# Patient Record
Sex: Male | Born: 1935 | Race: White | Hispanic: No | Marital: Married | Smoking: Never smoker
Health system: Southern US, Community
[De-identification: ages and names within clinical notes are randomized; demographics above are authoritative.]

## PROBLEM LIST (undated history)

## (undated) DIAGNOSIS — T4145XA Adverse effect of unspecified anesthetic, initial encounter: Secondary | ICD-10-CM

## (undated) DIAGNOSIS — E785 Hyperlipidemia, unspecified: Secondary | ICD-10-CM

## (undated) DIAGNOSIS — I10 Essential (primary) hypertension: Secondary | ICD-10-CM

## (undated) DIAGNOSIS — T8859XA Other complications of anesthesia, initial encounter: Secondary | ICD-10-CM

## (undated) DIAGNOSIS — Z8679 Personal history of other diseases of the circulatory system: Secondary | ICD-10-CM

## (undated) DIAGNOSIS — N200 Calculus of kidney: Secondary | ICD-10-CM

## (undated) DIAGNOSIS — F0390 Unspecified dementia without behavioral disturbance: Secondary | ICD-10-CM

## (undated) DIAGNOSIS — M21372 Foot drop, left foot: Secondary | ICD-10-CM

## (undated) DIAGNOSIS — I495 Sick sinus syndrome: Secondary | ICD-10-CM

## (undated) DIAGNOSIS — I639 Cerebral infarction, unspecified: Secondary | ICD-10-CM

## (undated) DIAGNOSIS — I2510 Atherosclerotic heart disease of native coronary artery without angina pectoris: Secondary | ICD-10-CM

## (undated) HISTORY — DX: Hyperlipidemia, unspecified: E78.5

## (undated) HISTORY — PX: BACK SURGERY: SHX140

## (undated) HISTORY — DX: Unspecified dementia, unspecified severity, without behavioral disturbance, psychotic disturbance, mood disturbance, and anxiety: F03.90

## (undated) HISTORY — DX: Sick sinus syndrome: I49.5

## (undated) HISTORY — PX: APPENDECTOMY: SHX54

## (undated) HISTORY — DX: Calculus of kidney: N20.0

## (undated) HISTORY — PX: HAND SURGERY: SHX662

## (undated) HISTORY — DX: Essential (primary) hypertension: I10

## (undated) HISTORY — DX: Cerebral infarction, unspecified: I63.9

## (undated) HISTORY — DX: Atherosclerotic heart disease of native coronary artery without angina pectoris: I25.10

## (undated) HISTORY — DX: Personal history of other diseases of the circulatory system: Z86.79

## (undated) HISTORY — DX: Foot drop, left foot: M21.372

---

## 1958-10-31 HISTORY — PX: TONSILLECTOMY AND ADENOIDECTOMY: SUR1326

## 1997-10-03 ENCOUNTER — Ambulatory Visit (HOSPITAL_COMMUNITY): Admission: RE | Admit: 1997-10-03 | Discharge: 1997-10-03 | Payer: Self-pay | Admitting: Internal Medicine

## 1998-03-01 HISTORY — PX: INSERT / REPLACE / REMOVE PACEMAKER: SUR710

## 1998-11-25 ENCOUNTER — Encounter: Payer: Self-pay | Admitting: Orthopedic Surgery

## 1998-11-25 ENCOUNTER — Ambulatory Visit (HOSPITAL_COMMUNITY): Admission: RE | Admit: 1998-11-25 | Discharge: 1998-11-25 | Payer: Self-pay | Admitting: Orthopedic Surgery

## 1998-12-02 ENCOUNTER — Ambulatory Visit (HOSPITAL_BASED_OUTPATIENT_CLINIC_OR_DEPARTMENT_OTHER): Admission: RE | Admit: 1998-12-02 | Discharge: 1998-12-02 | Payer: Self-pay | Admitting: Orthopedic Surgery

## 1999-07-14 ENCOUNTER — Ambulatory Visit (HOSPITAL_COMMUNITY): Admission: RE | Admit: 1999-07-14 | Discharge: 1999-07-14 | Payer: Self-pay | Admitting: Gastroenterology

## 1999-07-14 ENCOUNTER — Encounter (INDEPENDENT_AMBULATORY_CARE_PROVIDER_SITE_OTHER): Payer: Self-pay

## 2000-04-23 ENCOUNTER — Encounter: Payer: Self-pay | Admitting: Emergency Medicine

## 2000-04-23 ENCOUNTER — Inpatient Hospital Stay (HOSPITAL_COMMUNITY): Admission: EM | Admit: 2000-04-23 | Discharge: 2000-04-26 | Payer: Self-pay | Admitting: Emergency Medicine

## 2000-05-03 ENCOUNTER — Encounter: Admission: RE | Admit: 2000-05-03 | Discharge: 2000-05-03 | Payer: Self-pay | Admitting: Family Medicine

## 2000-08-25 ENCOUNTER — Ambulatory Visit (HOSPITAL_COMMUNITY): Admission: RE | Admit: 2000-08-25 | Discharge: 2000-08-25 | Payer: Self-pay | Admitting: *Deleted

## 2000-09-06 ENCOUNTER — Ambulatory Visit (HOSPITAL_COMMUNITY): Admission: AD | Admit: 2000-09-06 | Discharge: 2000-09-06 | Payer: Self-pay | Admitting: *Deleted

## 2000-09-09 ENCOUNTER — Ambulatory Visit (HOSPITAL_COMMUNITY): Admission: RE | Admit: 2000-09-09 | Discharge: 2000-09-09 | Payer: Self-pay | Admitting: *Deleted

## 2000-12-02 ENCOUNTER — Encounter: Payer: Self-pay | Admitting: *Deleted

## 2000-12-02 ENCOUNTER — Ambulatory Visit (HOSPITAL_COMMUNITY): Admission: RE | Admit: 2000-12-02 | Discharge: 2000-12-02 | Payer: Self-pay | Admitting: *Deleted

## 2001-01-23 ENCOUNTER — Ambulatory Visit (HOSPITAL_COMMUNITY): Admission: RE | Admit: 2001-01-23 | Discharge: 2001-01-24 | Payer: Self-pay | Admitting: *Deleted

## 2001-01-24 ENCOUNTER — Encounter: Payer: Self-pay | Admitting: *Deleted

## 2002-01-01 ENCOUNTER — Encounter: Payer: Self-pay | Admitting: Family Medicine

## 2002-01-01 ENCOUNTER — Encounter: Admission: RE | Admit: 2002-01-01 | Discharge: 2002-01-01 | Payer: Self-pay | Admitting: Family Medicine

## 2002-03-01 DIAGNOSIS — I495 Sick sinus syndrome: Secondary | ICD-10-CM

## 2002-03-01 HISTORY — DX: Sick sinus syndrome: I49.5

## 2002-03-09 ENCOUNTER — Inpatient Hospital Stay (HOSPITAL_COMMUNITY): Admission: EM | Admit: 2002-03-09 | Discharge: 2002-03-13 | Payer: Self-pay | Admitting: Emergency Medicine

## 2002-03-09 ENCOUNTER — Encounter: Payer: Self-pay | Admitting: Emergency Medicine

## 2002-03-12 ENCOUNTER — Encounter: Payer: Self-pay | Admitting: *Deleted

## 2002-03-13 ENCOUNTER — Encounter: Payer: Self-pay | Admitting: *Deleted

## 2004-12-15 ENCOUNTER — Ambulatory Visit (HOSPITAL_COMMUNITY): Admission: RE | Admit: 2004-12-15 | Discharge: 2004-12-15 | Payer: Self-pay | Admitting: Gastroenterology

## 2004-12-15 ENCOUNTER — Encounter (INDEPENDENT_AMBULATORY_CARE_PROVIDER_SITE_OTHER): Payer: Self-pay | Admitting: *Deleted

## 2005-04-15 ENCOUNTER — Ambulatory Visit: Admission: RE | Admit: 2005-04-15 | Discharge: 2005-04-15 | Payer: Self-pay | Admitting: Orthopedic Surgery

## 2005-04-16 ENCOUNTER — Ambulatory Visit (HOSPITAL_COMMUNITY): Admission: RE | Admit: 2005-04-16 | Discharge: 2005-04-16 | Payer: Self-pay | Admitting: *Deleted

## 2005-04-29 ENCOUNTER — Ambulatory Visit: Payer: Self-pay | Admitting: Physical Medicine & Rehabilitation

## 2005-04-29 ENCOUNTER — Inpatient Hospital Stay (HOSPITAL_COMMUNITY): Admission: RE | Admit: 2005-04-29 | Discharge: 2005-05-07 | Payer: Self-pay | Admitting: Surgery

## 2005-04-29 DIAGNOSIS — I6789 Other cerebrovascular disease: Secondary | ICD-10-CM

## 2005-04-29 HISTORY — PX: CORONARY ARTERY BYPASS GRAFT: SHX141

## 2005-05-17 ENCOUNTER — Ambulatory Visit: Payer: Self-pay | Admitting: Family Medicine

## 2005-05-17 ENCOUNTER — Inpatient Hospital Stay (HOSPITAL_COMMUNITY): Admission: EM | Admit: 2005-05-17 | Discharge: 2005-05-21 | Payer: Self-pay | Admitting: Emergency Medicine

## 2005-05-18 ENCOUNTER — Encounter: Payer: Self-pay | Admitting: Vascular Surgery

## 2005-05-21 ENCOUNTER — Inpatient Hospital Stay (HOSPITAL_COMMUNITY)
Admission: RE | Admit: 2005-05-21 | Discharge: 2005-06-07 | Payer: Self-pay | Admitting: Physical Medicine & Rehabilitation

## 2005-06-08 ENCOUNTER — Encounter
Admission: RE | Admit: 2005-06-08 | Discharge: 2005-09-06 | Payer: Self-pay | Admitting: Physical Medicine & Rehabilitation

## 2005-06-29 ENCOUNTER — Encounter: Admission: RE | Admit: 2005-06-29 | Discharge: 2005-06-29 | Payer: Self-pay | Admitting: Surgery

## 2005-07-05 ENCOUNTER — Encounter
Admission: RE | Admit: 2005-07-05 | Discharge: 2005-10-03 | Payer: Self-pay | Admitting: Physical Medicine & Rehabilitation

## 2005-08-02 ENCOUNTER — Ambulatory Visit: Payer: Self-pay | Admitting: Physical Medicine & Rehabilitation

## 2005-09-24 ENCOUNTER — Ambulatory Visit: Payer: Self-pay | Admitting: Physical Medicine & Rehabilitation

## 2005-11-08 ENCOUNTER — Ambulatory Visit: Payer: Self-pay | Admitting: Family Medicine

## 2005-11-11 ENCOUNTER — Ambulatory Visit: Payer: Self-pay | Admitting: Family Medicine

## 2005-11-29 HISTORY — PX: TOTAL KNEE ARTHROPLASTY: SHX125

## 2005-12-29 ENCOUNTER — Inpatient Hospital Stay (HOSPITAL_COMMUNITY): Admission: RE | Admit: 2005-12-29 | Discharge: 2006-01-02 | Payer: Self-pay | Admitting: Orthopedic Surgery

## 2006-01-11 ENCOUNTER — Ambulatory Visit: Payer: Self-pay

## 2006-09-29 IMAGING — CR DG CHEST 2V
2 series · 2 of 2 positions shown · non-contrast
Comparison: none

CLINICAL DATA: Weakness, dizziness. Tingling left arm and leg.  Open heart surgery two weeks ago.  
 CHEST- 2 VIEW:

[view not recorded (1 of 2)]
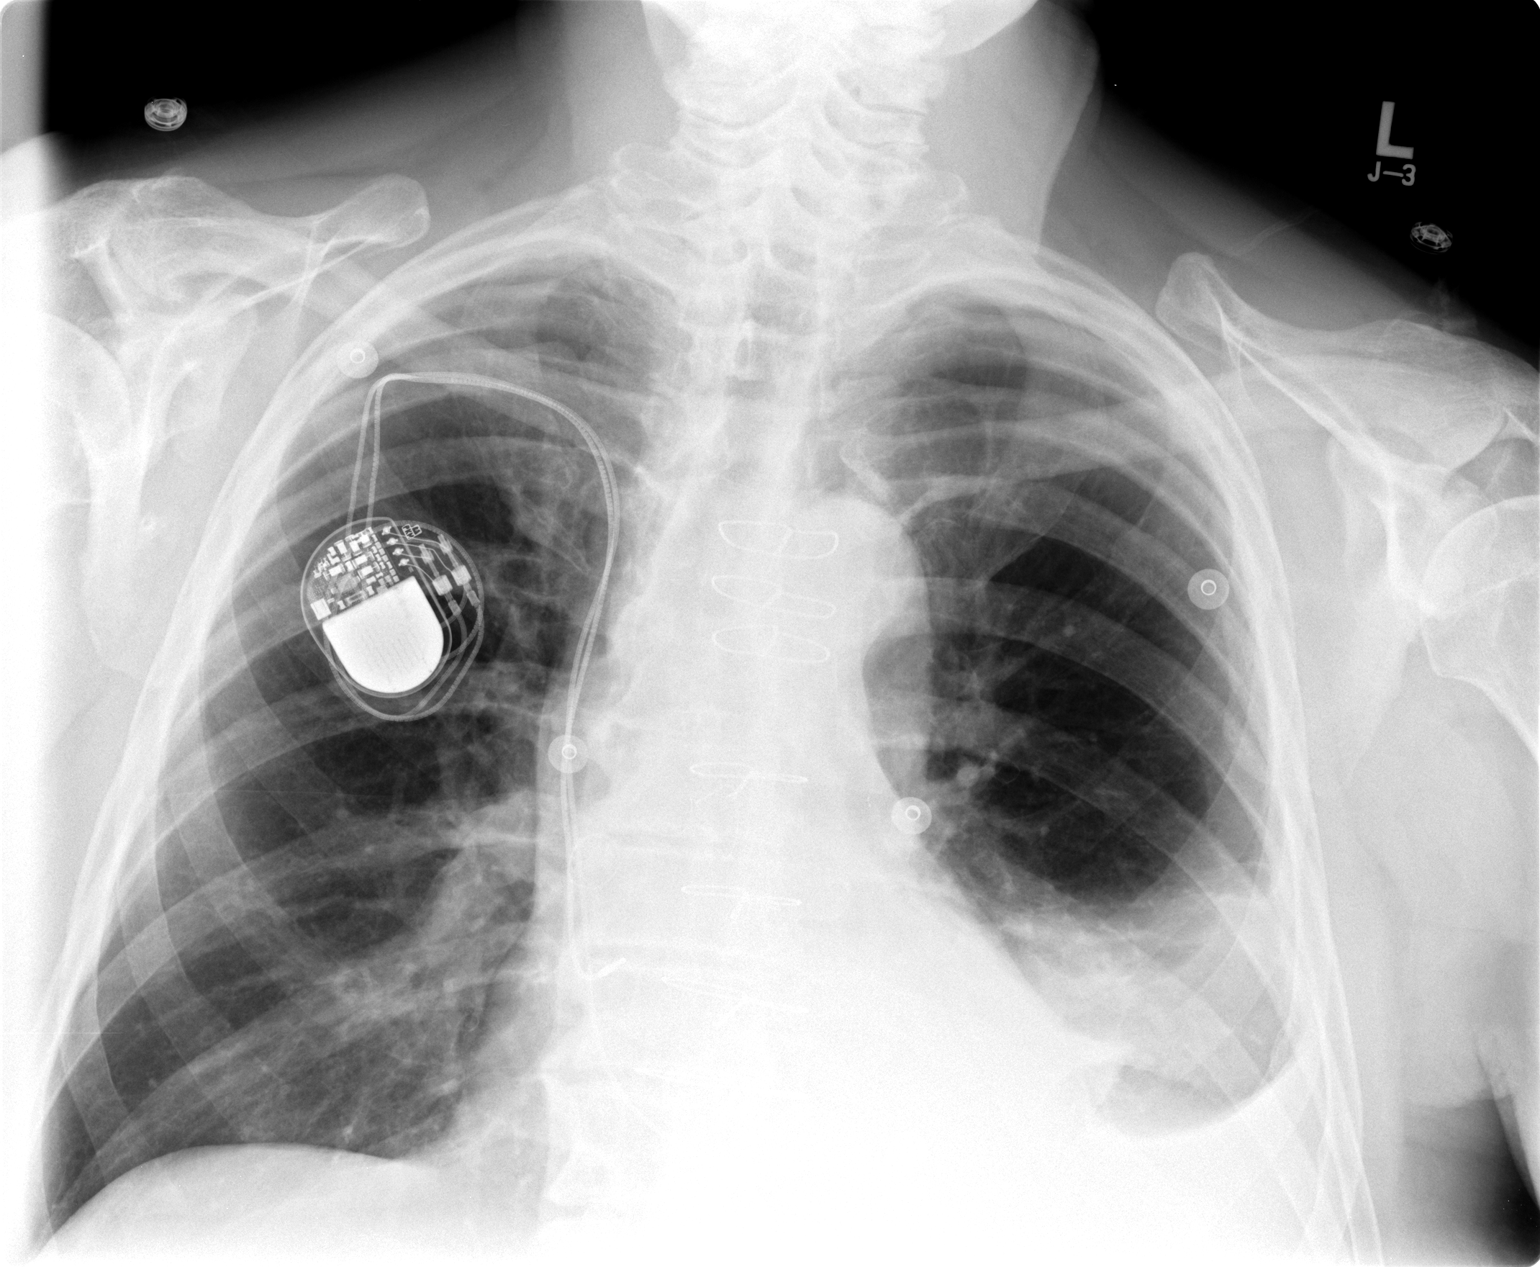

[view not recorded (2 of 2)]
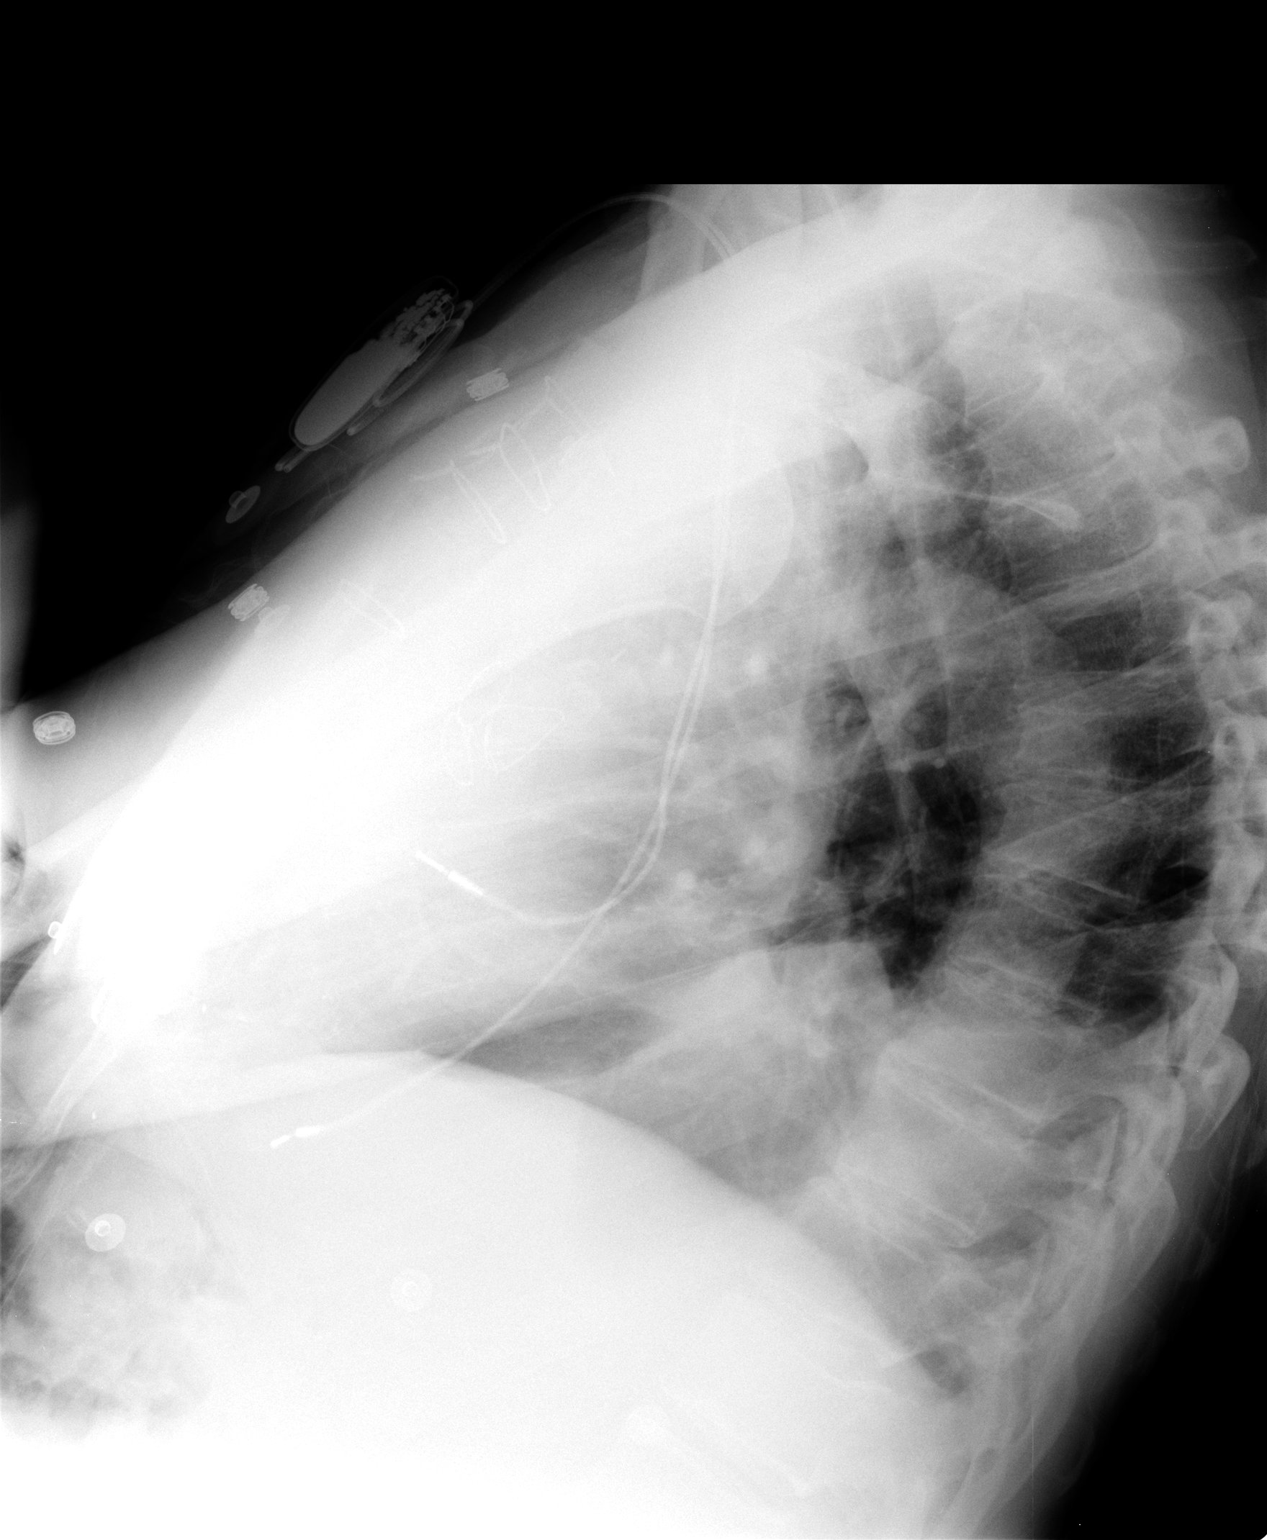

[2 of 2 positions shown; findings below may reference images not displayed]

FINDINGS: AP and lateral semierect films of the chest are made and are compared to previous studies of 05/01/05 and show again hyperaeration of the left base with left pleural effusion and/or scarring. The dual lead pacer system appears to be intact.  There has been previous coronary artery bypass grafts. No definite edema or pneumothorax is seen.
IMPRESSION: Postop changes of coronary artery bypass grafts with some atelectasis of the left base.  No pneumothorax or edema is present.  Stable dual lead pacer system.

## 2007-03-23 ENCOUNTER — Encounter (INDEPENDENT_AMBULATORY_CARE_PROVIDER_SITE_OTHER): Payer: Self-pay | Admitting: *Deleted

## 2007-03-23 ENCOUNTER — Ambulatory Visit: Payer: Self-pay | Admitting: Family Medicine

## 2007-03-23 DIAGNOSIS — I2510 Atherosclerotic heart disease of native coronary artery without angina pectoris: Secondary | ICD-10-CM

## 2007-03-23 DIAGNOSIS — K209 Esophagitis, unspecified: Secondary | ICD-10-CM

## 2007-03-23 DIAGNOSIS — I10 Essential (primary) hypertension: Secondary | ICD-10-CM | POA: Insufficient documentation

## 2007-03-23 DIAGNOSIS — M1710 Unilateral primary osteoarthritis, unspecified knee: Secondary | ICD-10-CM

## 2007-03-23 DIAGNOSIS — F329 Major depressive disorder, single episode, unspecified: Secondary | ICD-10-CM

## 2007-03-23 DIAGNOSIS — N209 Urinary calculus, unspecified: Secondary | ICD-10-CM

## 2007-03-23 DIAGNOSIS — E782 Mixed hyperlipidemia: Secondary | ICD-10-CM

## 2007-03-23 DIAGNOSIS — F3289 Other specified depressive episodes: Secondary | ICD-10-CM | POA: Insufficient documentation

## 2007-03-23 LAB — CONVERTED CEMR LAB
ALT: 16 units/L (ref 0–53)
AST: 21 units/L (ref 0–37)
Albumin: 4.5 g/dL (ref 3.5–5.2)
Alkaline Phosphatase: 39 units/L (ref 39–117)
BUN: 15 mg/dL (ref 6–23)
CO2: 26 meq/L (ref 19–32)
Calcium: 9.1 mg/dL (ref 8.4–10.5)
Chloride: 102 meq/L (ref 96–112)
Creatinine, Ser: 1.08 mg/dL (ref 0.40–1.50)
Direct LDL: 74 mg/dL
Glucose, Bld: 76 mg/dL (ref 70–99)
Potassium: 4.8 meq/L (ref 3.5–5.3)
Sodium: 139 meq/L (ref 135–145)
Total Bilirubin: 1.4 mg/dL — ABNORMAL HIGH (ref 0.3–1.2)
Total Protein: 6.9 g/dL (ref 6.0–8.3)

## 2007-03-27 LAB — CONVERTED CEMR LAB: LDL Cholesterol: 74 mg/dL

## 2007-04-02 HISTORY — PX: REVISION TOTAL KNEE ARTHROPLASTY: SHX767

## 2007-04-10 ENCOUNTER — Inpatient Hospital Stay (HOSPITAL_COMMUNITY): Admission: RE | Admit: 2007-04-10 | Discharge: 2007-04-13 | Payer: Self-pay | Admitting: Orthopedic Surgery

## 2007-04-11 ENCOUNTER — Ambulatory Visit: Payer: Self-pay | Admitting: Physical Medicine & Rehabilitation

## 2007-04-25 ENCOUNTER — Ambulatory Visit: Payer: Self-pay | Admitting: Family Medicine

## 2007-04-25 ENCOUNTER — Ambulatory Visit (HOSPITAL_COMMUNITY): Admission: RE | Admit: 2007-04-25 | Discharge: 2007-04-25 | Payer: Self-pay | Admitting: Family Medicine

## 2007-04-25 ENCOUNTER — Telehealth: Payer: Self-pay | Admitting: *Deleted

## 2007-04-25 DIAGNOSIS — R42 Dizziness and giddiness: Secondary | ICD-10-CM

## 2007-04-26 ENCOUNTER — Telehealth: Payer: Self-pay | Admitting: *Deleted

## 2007-04-28 ENCOUNTER — Encounter (INDEPENDENT_AMBULATORY_CARE_PROVIDER_SITE_OTHER): Payer: Self-pay | Admitting: *Deleted

## 2007-05-05 ENCOUNTER — Encounter (INDEPENDENT_AMBULATORY_CARE_PROVIDER_SITE_OTHER): Payer: Self-pay | Admitting: *Deleted

## 2007-05-05 ENCOUNTER — Ambulatory Visit: Payer: Self-pay | Admitting: Family Medicine

## 2007-05-05 LAB — CONVERTED CEMR LAB
HCT: 39.5 %
Hemoglobin: 13.1 g/dL
MCHC: 33.2 g/dL
MCV: 97.5 fL
Platelets: 340 K/uL
RBC: 4.05 M/uL — ABNORMAL LOW
RDW: 13.9 %
WBC: 6.3 10*3/microliter

## 2007-05-25 ENCOUNTER — Ambulatory Visit: Payer: Self-pay | Admitting: Family Medicine

## 2007-05-25 ENCOUNTER — Encounter (INDEPENDENT_AMBULATORY_CARE_PROVIDER_SITE_OTHER): Payer: Self-pay | Admitting: *Deleted

## 2007-05-30 ENCOUNTER — Encounter (INDEPENDENT_AMBULATORY_CARE_PROVIDER_SITE_OTHER): Payer: Self-pay | Admitting: *Deleted

## 2007-06-06 ENCOUNTER — Encounter (INDEPENDENT_AMBULATORY_CARE_PROVIDER_SITE_OTHER): Payer: Self-pay | Admitting: *Deleted

## 2007-07-27 ENCOUNTER — Encounter: Payer: Self-pay | Admitting: *Deleted

## 2007-11-15 ENCOUNTER — Ambulatory Visit: Payer: Self-pay | Admitting: Family Medicine

## 2007-11-15 ENCOUNTER — Encounter: Payer: Self-pay | Admitting: Family Medicine

## 2007-11-23 ENCOUNTER — Encounter: Payer: Self-pay | Admitting: Family Medicine

## 2007-12-13 ENCOUNTER — Encounter: Payer: Self-pay | Admitting: Family Medicine

## 2007-12-13 ENCOUNTER — Ambulatory Visit: Payer: Self-pay | Admitting: Family Medicine

## 2007-12-13 DIAGNOSIS — G4730 Sleep apnea, unspecified: Secondary | ICD-10-CM | POA: Insufficient documentation

## 2007-12-13 DIAGNOSIS — M25539 Pain in unspecified wrist: Secondary | ICD-10-CM

## 2007-12-13 LAB — CONVERTED CEMR LAB: PSA: 0.92 ng/mL (ref 0.10–4.00)

## 2007-12-19 ENCOUNTER — Telehealth (INDEPENDENT_AMBULATORY_CARE_PROVIDER_SITE_OTHER): Payer: Self-pay | Admitting: *Deleted

## 2007-12-21 ENCOUNTER — Encounter: Payer: Self-pay | Admitting: Family Medicine

## 2007-12-22 ENCOUNTER — Encounter: Admission: RE | Admit: 2007-12-22 | Discharge: 2007-12-22 | Payer: Self-pay | Admitting: Family Medicine

## 2007-12-29 ENCOUNTER — Telehealth: Payer: Self-pay | Admitting: Family Medicine

## 2008-06-30 ENCOUNTER — Encounter: Payer: Self-pay | Admitting: Family Medicine

## 2008-11-12 ENCOUNTER — Encounter: Admission: RE | Admit: 2008-11-12 | Discharge: 2008-11-12 | Payer: Self-pay | Admitting: Internal Medicine

## 2008-11-19 ENCOUNTER — Ambulatory Visit: Payer: Self-pay | Admitting: Internal Medicine

## 2008-11-19 DIAGNOSIS — J310 Chronic rhinitis: Secondary | ICD-10-CM

## 2008-11-19 DIAGNOSIS — R042 Hemoptysis: Secondary | ICD-10-CM | POA: Insufficient documentation

## 2009-04-09 ENCOUNTER — Emergency Department (HOSPITAL_COMMUNITY): Admission: EM | Admit: 2009-04-09 | Discharge: 2009-04-09 | Payer: Self-pay | Admitting: Emergency Medicine

## 2010-03-22 ENCOUNTER — Encounter: Payer: Self-pay | Admitting: Surgery

## 2010-04-30 LAB — HM COLONOSCOPY

## 2010-05-20 LAB — COMPREHENSIVE METABOLIC PANEL
Alkaline Phosphatase: 45 U/L (ref 39–117)
BUN: 14 mg/dL (ref 6–23)
CO2: 25 mEq/L (ref 19–32)
Chloride: 101 mEq/L (ref 96–112)
Creatinine, Ser: 1.1 mg/dL (ref 0.4–1.5)
GFR calc non Af Amer: >60 mL/min (ref >60)
Glucose, Bld: 121 mg/dL — ABNORMAL HIGH (ref 70–99)
Potassium: 4 mEq/L (ref 3.5–5.1)
Total Bilirubin: 1.8 mg/dL — ABNORMAL HIGH (ref 0.3–1.2)

## 2010-05-20 LAB — CBC
HCT: 44 % (ref 39.0–52.0)
Hemoglobin: 15.6 g/dL (ref 13.0–17.0)
MCV: 99.9 fL (ref 78.0–100.0)
WBC: 10.1 10*3/uL (ref 4.0–10.5)

## 2010-05-20 LAB — URINALYSIS, ROUTINE W REFLEX MICROSCOPIC
Bilirubin Urine: NEGATIVE
Ketones, ur: NEGATIVE mg/dL
Leukocytes, UA: NEGATIVE
Nitrite: NEGATIVE
Urobilinogen, UA: 1 mg/dL (ref 0.0–1.0)

## 2010-05-20 LAB — DIFFERENTIAL
Basophils Absolute: 0 10*3/uL (ref 0.0–0.1)
Basophils Relative: 0 % (ref 0–1)
Lymphocytes Relative: 8 % — ABNORMAL LOW (ref 12–46)
Monocytes Absolute: 0.5 10*3/uL (ref 0.1–1.0)
Neutro Abs: 8.8 10*3/uL — ABNORMAL HIGH (ref 1.7–7.7)
Neutrophils Relative %: 87 % — ABNORMAL HIGH (ref 43–77)

## 2010-05-20 LAB — LIPASE, BLOOD: Lipase: 33 U/L (ref 11–59)

## 2010-05-20 LAB — URINE MICROSCOPIC-ADD ON

## 2010-07-14 NOTE — Op Note (Signed)
Kurt [name], Kurt [name]             ACCOUNT NO.:  [id number]   MEDICAL RECORD NO.:  [id number]          PATIENT TYPE:  INP   LOCATION:  2550                         FACILITY:  [facility]   PHYSICIAN:  Robert A. Thurston Hole, M.D. DATE OF BIRTH:  1932-02-09   DATE OF PROCEDURE:  04/10/2007  DATE OF DISCHARGE:                               OPERATIVE REPORT   PREOPERATIVE DIAGNOSIS:  Right knee degenerative joint disease.   POSTOPERATIVE DIAGNOSIS:  Right knee degenerative joint disease.   PROCEDURE:  Right total knee replacement using Dupuy cemented total knee  system with #4 cemented femur, #5 cemented tibia, with 12.5 mm  polyethylene RP tibial spacer and 38 mm polyethylene cemented patella.   SURGEON:  Elana Alm. Thurston Hole, M.D.   ASSISTANT:  Julien Girt, P.A.   ANESTHESIA:  General.   OPERATIVE TIME:  1 hour 30 minutes.   COMPLICATIONS:  None.   DESCRIPTION OF PROCEDURE:  The patient is brought to the operating room  on April 10, 2007, after femoral nerve block was placed in the  holding room by anesthesia.  He was placed on the operating table in the  supine position.  He received Ancef 2 grams IV preoperatively for  prophylaxis.  After being placed under general anesthesia, he had a  Foley catheter placed under sterile conditions.  His right knee was  examined.  Range of motion from -5 to 125 degrees, mild varus deformity,  knee stable ligamentous examination with normal patellar tracking.  The  right leg was prepped using sterile DuraPrep and draped using sterile  technique.  The leg was exsanguinated and a thigh tourniquet elevated  365 mm.  Initially through a 15 cm longitudinal incision based over the  patella, initial exposure was made.  The underlying subcutaneous tissues  were incised along with skin incision.  A median arthrotomy was  performed revealing an excessive amount of normal appearing joint fluid.  The articular surfaces were inspected.  He had grade IV  changes  medially, grade III changes laterally, and grade III and IV changes in  the patellofemoral joint.  Osteophytes were removed from the femoral  condyles and tibial plateau.  The medial and lateral meniscal remnants  were removed as well as the anterior cruciate ligament.  An  intramedullary drill was drilled up the femoral canal for placement of  distal femoral cutting jig which was placed in the appropriate amount of  rotation and a distal 13 mm cut was made.  The distal femur was then  sized.  #4 was found to be the appropriate size.  A #4 cutting jig was  placed in the appropriate amount of external rotation and then these  cuts were made.  At this point the proximal tibia was exposed.  The  tibial spines were removed with an oscillating saw.  Intramedullary  drill was drilled down the tibial canal for placement of the proximal  tibial cutting jig which was placed in the appropriate amount of  rotation and a proximal 4 mm cut was made based off the medial or lower  side.  Spacer blocks were then placed in  flexion and extension.  12.5 mm  blocks gave excellent balancing, excellent stability, and excellent  correction of his flexion and varus deformities.  At this point, the #5  tibial base plate trial was placed on the cut tibial surfaces with an  excellent fit and then the keel cut was made.  The PCL box cutter was  then placed on the distal femur and these cuts were made.  At this point  the #4 femoral trial was placed and with the #5 tibial base plate trial  and a 12.5 mm polyethylene RP tibial spacer, the knee was reduced, taken  through a range of motion of 0-125 degrees with excellent stability and  excellent correction of his flexion and varus deformities and normal  patella tracking.  Resurfacing 9 mm cut was made and three locking holes  placed for a 38 mm patella.  The patellar trial was placed and again  patellofemoral tracking was evaluated and found to be normal.  At  this  point it was felt that all of the trial components were of excellent  size, fit, and stability.  They were then removed.  The knee was then  jet lavage irrigated with 3 liters of saline.  The proximal tibia was  then exposed.  A #5 tibial base plate with cement backing was hammered  into position with an excellent fit with excess cement being removed  from around the edges.  A #4 femoral component with cement backing was  hammered into position also with an excellent fit with excess cement  being removed from around the edges.  The 12.5 mm polyethylene RP tibial  spacer was placed on the tibial base plate, the knee reduced, taken  through a range of motion from 0 to 125 degrees with excellent stability  and excellent correction of his flexion and varus deformities.  The 38  mm polyethylene cement back patella was placed into its position and  held there with a clamp.  After the cement hardened, patellofemoral  tracking was again evaluated and found to be normal.  At this point, it  was felt that all of the components were of excellent size, fit, and  stability.  The wound was further irrigated with saline.  The tourniquet  was released.  Hemostasis obtained with cautery.  The arthrotomy was  then closed with #1 Ethibond suture over two medium Hemovac drains.  Subcutaneous tissues were closed with 0 and 2-0 Vicryl.  Subcuticular  layer closed with 4-0 Monocryl.  Sterile dressings and long leg splint  applied.  The patient then awakened, extubated, and taken to the  recovery room in stable condition.  Needle, sponge, and instrument  counts correct x2 at the end of the case.  Neurovascular status was  normal postoperatively as well.      Robert A. Thurston Hole, M.D.  Electronically Signed     RAW/MEDQ  D:  04/10/2007  T:  04/10/2007  Job:  [id number]

## 2010-07-14 NOTE — Discharge Summary (Signed)
NAMERAMESSES, CRAMPTON             ACCOUNT NO.:  [id number]   MEDICAL RECORD NO.:  [id number]          PATIENT TYPE:  INP   LOCATION:  5013                         FACILITY:  [facility]   PHYSICIAN:  Robert A. Thurston Hole, M.D. DATE OF BIRTH:  24-Oct-1931   DATE OF ADMISSION:  04/10/2007  DATE OF DISCHARGE:  04/13/2007                               DISCHARGE SUMMARY   ADMISSION DIAGNOSES:  1. End-stage degenerative joint disease right knee, status post left      total knee replacement.  2. Status post coronary artery bypass graft.  3. History of intraoperative stroke during his coronary artery bypass      graft.  4. History of a stroke two weeks later.  5. Hypertension.  6. Coronary artery disease.  7. Memory loss.  8. Pacemaker placement.   HISTORY OF PRESENT ILLNESS:  The patient is a 77 year old white male  with a history of bilateral knee end-stage DJD.  He had a left total  knee replacement and did very well postoperatively with this.  He  elected to undergo a right total knee replacement on April 10, 2007,  and postoperatively has progressed well with this.   PROCEDURE:  On April 10, 2007, the patient underwent a right total  knee replacement by Dr. Thurston Hole and a right femoral nerve block by  anesthesia as well as Autovac transfusion x2.  He tolerated all of the  procedures well without difficulty.   HOSPITAL COURSE:  Postoperatively, he was admitted for pain control, DVT  prophylaxis, and physical therapy.  In the recovery room, a CPM was  placed at 0-90 degrees.  He tolerated this overnight.   On postoperative day #1 the patient was afebrile.  His hemoglobin was  11.6.  He was slightly hyponatremic at 132, but completely asymptomatic.  He progressed well with physical therapy ambulating 150 feet.  On  postoperative day #1 he did have some nausea, therefore, Reglan was  started.  His surgical wound is well approximated and healing.  On  postoperative day #2, he had a TMAX of  100.7.  Temperature on  examination was 98.6, pulse was 83, blood pressure 111/58, O2 saturation  is 95%.  His hemoglobin was 10.6.  His INR was 1.7.  He tolerated 0-90  on a CPM.  He ambulated 20 feet.  His hemoglobin today on discharge is  10.0.  His INR is still pending at this time, we will hand write it in  and his BMET is still pending at this time.  His TMAX over the last 24  hours was 100.1 yesterday at 2 p.m.  Overnight he has been afebrile with  a temperature of 98 and a temperature of 97.1.  His blood pressure is  stable at 108/56 maintaining between 105 and 113 and between 50 and 60  in the diastolic range.  He is maintaining his O2 saturations between 93  and 96% on room air with no shortness of breath, no difficulty  breathing, and no chest pain.  He is being discharged to skilled nursing  facility today for daily physical therapy.  He will need daily  dressing  changes.  Please call our office with increased redness, increased  swelling, increased drainage or a temperature greater than 101.  He will  need to have his right heel elevated on a folded pillow with his toes  facing straight up for 30 minutes once a day to work on extension on the  back of his knee.  He will need to use his CPM 0-90 degrees 8 hours a  day until he is two weeks postoperatively.  He will need to maintain on  Coumadin to maintain his INR between 2.0 and 3.0 until he is four weeks  postoperatively.  He will be on a regular cardiac diet.   DISCHARGE MEDICATIONS:  1. OxyIR 5 mg one tablet every 4-6 hours as needed for pain.  2. Robaxin 500 mg one every 4-6 hours as needed for muscle spasm.  3. Coumadin 5 mg daily.  He will need a PT/INR check tomorrow which is      April 14, 2007, and need his Coumadin adjusted as appropriate.  4. Metoprolol 50 mg twice a day.  5. Simvastatin 40 mg daily.  6. Aggrenox 25 mg twice a day.  7. Lexapro 10 mg daily.  8. Aricept 10 mg daily.  9. Tylenol arthritis one  tablet twice a day.  10.Multivitamin one tablet daily.  11.Colace 100 mg one tablet twice a day and if he has not had a bowel      movement in two days, he will need some type of laxative.  His last      bowel movement in the hospital was April 12, 2007.   FOLLOWUP:  He will need to follow up with Dr. Thurston Hole in the office on  April 24, 2007, please call [phone] to set up an appointment time  for this.  He will need x-rays in the office.   He is being discharged to a skilled nursing facility in stable  condition, weightbearing as tolerated on a regular heart healthy diet  with daily physical therapy, daily medications, daily dressing changes  and follow-up with Dr. Thurston Hole on April 24, 2007.      Kirstin Shepperson, P.A.      Robert A. Thurston Hole, M.D.  Electronically Signed    KS/MEDQ  D:  04/13/2007  T:  04/13/2007  Job:  [id number]

## 2010-07-17 NOTE — Consult Note (Signed)
Kurt [name], GALLERY             ACCOUNT NO.:  [id number]   MEDICAL RECORD NO.:  [id number]          PATIENT TYPE:  INP   LOCATION:  3742                         FACILITY:  [facility]   PHYSICIAN:  Genene Churn. Love, M.D.    DATE OF BIRTH:  Sep 09, 1931   DATE OF CONSULTATION:  05/17/2005  DATE OF DISCHARGE:                                   CONSULTATION   This 77 year old right-handed white married male is seen for evaluation of  new stroke.   HISTORY OF PRESENT ILLNESS:  Kurt [name] has a history of hypertension,  dyslipidemia, pacemaker three years ago, and coronary artery disease, status  post five-vessel coronary artery bypass graft surgery in late March 2007.  Following his surgery he had a left brain stroke with right-sided weakness  and CT scan showed evidence of bicerebral small vessel disease, ischemic  stroke. I cannot find report of Dopplers and he did not have a 2-D  echocardiogram. He did not have atrial fibrillation that I can document  postoperatively. At home he was doing well until this morning when he  developed dizziness, left-sided numbness involving primarily his foot, and  some confusion. He was brought to the emergency room.   PAST MEDICAL HISTORY:  1.  Left brain stroke in March 2007.  2.  Hypertension.  3.  Dyslipidemia.  4.  Coronary artery disease, status post five-vessel coronary artery bypass.  5.  Gastroesophageal reflux disease.  6.  Pacemaker in 2003.  7.  History of lumbar spine disease.   MEDICATIONS:  Aspirin, Protonix, Toprol XL, Zocor, Altace, Ambien, Ultram.   PHYSICAL EXAMINATION:  GENERAL: Well-developed white male.  VITAL SIGNS: Blood pressure in the right arm 130/80 and left arm 120/80.  There were no bruits.  NEUROLOGIC: He is alert and oriented times three, except he did know the  month thinking it was May. There was no denial of the left side. His cranial  nerve examination revealed left homonymous hemianopsia, mild left 7th, mild  dysarthria. Tongue midline. Left arm clumsiness with old left hand surgery  to tendons. The rest within normal limits. Sensory examination is intact to  pinprick, touch, joint position and vibration testing. Deep tendon reflexes  are 1 to 2+.  Plantar responses were downgoing.   CT scan is unchanged showing evidence of bicerebral small vessel ischemic  stroke.   Sodium 132, potassium 4.6, chloride 102, CO2 content 25, BUN 18, creatinine  1.1, glucose 109. White blood cell count 8600, hemoglobin 13.6, hematocrit  39.4, platelet count 552,000.   IMPRESSION:  1.  New right brain stroke with left homonymous field cut. Suspect PCA      distribution, code 434.01 and 368.40.  2.  Old left brain stroke from April 29, 2005, or April 30, 2005, code 434.01.  3.  Bicerebral stroke by CT scan, code 40.31.  4.  Hypertension, code 796.2.  5.  Coronary artery disease, status post coronary artery bypass graft      surgery, code 429.2.  6.  Pacemaker.  7.  Dyslipidemia, code 272.4. Today, the patient has possibly had some  hematuria.   PLAN:  Use rectal aspirin, obtain a urinalysis, give him IV fluids, and have  cardiology see him for TEE.           ______________________________  Genene Churn. Sandria Manly, M.D.     JML/MEDQ  D:  05/17/2005  T:  05/18/2005  Job:  [id number]

## 2010-07-17 NOTE — Discharge Summary (Signed)
Kurt [name], Kurt [name]             ACCOUNT NO.:  [id number]   MEDICAL RECORD NO.:  [id number]          PATIENT TYPE:  INP   LOCATION:  3711                         FACILITY:  [facility]   PHYSICIAN:  Robert A. Thurston Hole, M.D. DATE OF BIRTH:  1931/11/29   DATE OF ADMISSION:  12/29/2005  DATE OF DISCHARGE:  01/02/2006                               DISCHARGE SUMMARY   ADMITTING DIAGNOSES:  1. End-stage degenerative joint disease, left knee.  2. History of a stroke x2.  3. Coronary artery disease, status post coronary artery bypass      grafting.  4. Hyperlipidemia.  5. Hypertension.  6. Pacemaker implantation.  7. Gastroesophageal reflux.  8. Renal calculi.   DISCHARGE DIAGNOSES:  1. End-stage degenerative joint disease, left knee, status post total      knee.  2. Coronary artery disease, status post coronary artery bypass graft.  3. Cerebrovascular disease, status post a stroke x2.  4. Hyperlipidemia.  5. Hypertension.  6. Osteoarthritis.  7. Pacemaker implantation.  8. Gastroesophageal reflux.  9. Renal disease.  10.Drug-induced delirium.  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   HISTORY OF PRESENT ILLNESS:  The patient is a 77 year old white male  with a history of end-stage DJD of his left knee.  He has failed  conservative care including anti-inflammatories, cortisone injections  and Supartz.  He now understands the risks, benefits and possible  complications of a left total knee replacement and is without question.  Due to his two strokes intraoperatively in March 2007, his Aggrenox was  not stopped preoperatively or postoperatively except was not taken the  day of surgery.   PROCEDURES IN HOUSE:  On December 29, 2005, the patient underwent a left  total knee replacement by Dr. Thurston Hole and a femoral nerve block by  anesthesia.  He tolerated both procedures well and was admitted for pain  control, DVT prophylaxis and physical therapy.  Neurology was consulted  to evaluate for a brief  loss of consciousness postoperatively as well as  to determine whether or not he needed Avelox postoperatively.  [facility] was consulted to help manage his  hypertension and coronary artery disease.  Both of them saw him in PACU.  From a neurologic standpoint Dr. Sandria Manly felt he was very stable and agreed  with continuing the Aggrenox with the Coumadin.  Postop day #1 the  patient was doing great.  He had no complaints except knee pain.  He was  alert and oriented x3.  His hemoglobin was 12.  His INR was 1.1.  He was  metabolically stable.  He had a significant amount of drainage from his  knee.  His drain was pulled.  A new dressing was placed.  His IV was  saline-locked.  His PCA was discontinued, his Foley was discontinued,  and he was transferred to 5000 because he was stable from a cardiac  standpoint.  He was in a paced rhythm.  Postop day #2 the patient had  acute complaints of chest pain.  EKG was taken.  Cardiology was called.  A spiral CT was done to rule out a PE.  Cardiac  enzymes were done.  Dr.  Domingo Sep was called his.  No other anticoagulation was added to this  patient's regimen due to his significant anticoagulation already with  his Aggrenox and Coumadin.  The pacemaker had recently been checked and  was stable.  Postop day #3, the patient had acute episode of delirium.  He was oriented to person but not to situation or place.  His surgical  wound was well-approximated.  His extraocular movements were intact.  He  had full range of motion of both upper extremities.  He was able to  smile, to talk.  His tongue was midline.  He had full range of motion of  his right lower extremity.  His narcotics were discontinued and  neurology was consulted for evaluation.  Did a CT of his brain.  He did  not have any new strokes.  They felt the acute delirium was due to  narcotic pain medicine.  This was discontinued.  He was placed on  Seroquel for sundowning.   Postop day #4 the patient was alert and  oriented to person, not to date, time or situation.  He had no  complaints of pain.  His hemoglobin was 9.3.  his INR was 3.0.  He was  progressing well with physical therapy.  We felt his confusion was due  to acute delirium due to hospitalization and medications on top of  possibly some baseline dementia.  He was discharged to home in stable  condition with his wife and family with the addition of:   1. Seroquel 25 mg q.h.s.  2. Coumadin 5 mg half a tablet today and one tablet daily.  3. Metoprolol 100 mg one tablet twice a day.  4. Lexapro 10 mg one tablet daily.  5. Multivitamin 1 tablet daily.  6. Simvastatin 40 mg daily.  7. Aggrenox 25 mg one tablet twice a day.  8. He has been instructed not to take his Ultram or his Triple Flex.   He has been instructed to use his CPM 0-90 degrees 8 hours a day,  elevate his left heel on a folded pillow.  He has been instructed to  sleep in his knee immobilizer in case he gets bed for safety.  His wife  will change his dressing daily.  She will call with increased redness,  increased swelling, increased pain or a temperature greater than 101.  He may shower or bathe on Wednesday.  He will follow up with Dr. Thurston Hole  on December 11, 2005.  He is on a regular diet.      Kirstin Shepperson, P.A.      Robert A. Thurston Hole, M.D.  Electronically Signed    KS/MEDQ  D:  02/02/2006  T:  02/02/2006  Job:  [id number]

## 2010-07-17 NOTE — Discharge Summary (Signed)
NAME:  Kurt [name], Kurt [name]                       ACCOUNT NO.:  [id number]   MEDICAL RECORD NO.:  [id number]                   PATIENT TYPE:  INP   LOCATION:  2019                                 FACILITY:  [facility]   PHYSICIAN:  Darlin Priestly, M.D.             DATE OF BIRTH:  09-18-1931   DATE OF ADMISSION:  DATE OF DISCHARGE:  03/13/2002                                 DISCHARGE SUMMARY   HISTORY OF PRESENT ILLNESS:  The patient is a 77 year old white married male  patient of Dr. Jenne Campus with a history of syncope dating back to 2000. His  workup has included a head CT scan, carotid Dopplers, tilt table test and a  2D echo without any significant findings of these tests. He has also had a  loop recorder in, however, this did not stay in very long because he had an  open wound. He did not contact our office, and it was eventually explanted.  I believe he had no arrhythmias on the loop recorder for the period of time  that it was in.   He was last seen by Dr. Jenne Campus on March 08, 2002, and at that time he was  put on Florinef. Apparently today he was at work, he bent over to pick up  some wine, and he passed out for about 30 seconds. He had no loss of bowel  or bladder. He did receive a superficial laceration of his face. He was seen  by Dr. Lucas Mallow initially in the emergency room and he was admitted.   HOSPITAL COURSE:  He was then seen by Dr. Jacinto Halim the following day, who did a  carotid sinus massage and he had a 7.5 second pause with reproduction of his  symptoms. Thus it was determined that he would need to undergo pacemaker for  carotid hypersensitivity syndrome.   This was performed on March 12, 2002, by Dr. Lenise Herald. A Medtronic  HAPA KDR 901 was placed with 2 Medtronic leads. The following day it was  interrogated with some noted change in the R values. This was discussed with  Dr. Jenne Campus. It was decided it was safe for him to be discharged to home.   He had a chest  x-ray that showed no pneumothorax. The leads were in correct  placement. His EKG on the morning of March 13, 2002, showed normal sinus  rhythm with first degree AV block. The day of discharge his blood pressure  was 138/64, his pulse was 60 and his temperature was 97.   DISCHARGE MEDICATIONS:  1. Toprol  XL 50 mg q.d.  2. Nexium 40 mg q.d.  3. Aspirin 81 mg q.d.  4. Celebrex 200 mg q.d.   DISCHARGE INSTRUCTIONS:  He was told to do no lifting, no stretching, no  pulling, no swinging of arms. He may raise his right arm to the top of his  head tomorrow. He should do no reaching  with his arm above his head and no  driving. He should keep the incision clean and dry for 4 days and he may  wash with soap and water and gently pat dry. If any swelling or oozing  occurs he will give [place] a call. He should not take the tape off.   FOLLOW UP:  He will follow up with Dr. Jenne Campus at 9:30 on March 20, 2002.   DISCHARGE DIAGNOSES:  1. 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  2. Hypertension.  3.     Dyslipidemia.  4. Left bundle branch block.  5. Moderate coronary artery disease with normal left ventricular function.     Lezlie Octave, N.P.                        Darlin Priestly, M.D.    BB/MEDQ  D:  03/13/2002  T:  03/13/2002  Job:  [id number]   cc:   Olena Leatherwood [facility]

## 2010-07-17 NOTE — Cardiovascular Report (Signed)
[place]. [facility]  Patient:    Kurt [name], Kurt [name]                    MRN: [id number] Proc. Date: 08/25/00 Adm. Date:  [id number] Attending:  Lenise Herald H                        Cardiac Catheterization  PROCEDURE: 1. Left heart catheterization. 2. Coronary angiography. 3. Left ventriculogram.  COMPLICATIONS:  None.  INDICATIONS:  Kurt [name] is a 77 year old white male patient of Kurt [name] with a history of syncope in February with subsequent negative head CT and negative carotid Dopplers.  Two-dimensional echocardiogram revealed normal EF.  He subsequently underwent Persantine Cardiolite to rule out ischemia on Jun 30, 2000, which revealed inferior wall scar with suggestion of anteroseptal wall ischemia, however, the patient was noted to have a left bundle branch block and this was difficult to interpret.  He has had no chest pain.  After discussing this with his family, he did elect to undergo cardiac catheterization to define significant CAD.  Of note, the patient had a recurrent syncopal episode approximately five days prior to his admission, however, he did not notify our office.  He comes to the hospital today with multiple stitches throughout his head and ecchymosis beneath his right eye. Again this had no antecedal symptoms and it occurred while standing climbing on a car.  DESCRIPTION OF PROCEDURE:  After giving informed written consent, the patient was brought to the cardiac catheterization lab where the right and left groins were shaved, prepped and draped in the usual sterile fashion.  ECG monitoring was established.  Using modified Seldinger technique, a #6 French arterial sheath was inserted into the right femoral artery.  A 6 French diagnostic catheter was then used to perform diagnostic angiography.  This reveals a large left main with no significant disease.  LAD is a large vessel which courses the apex and gives rise to one  diagonal branch.  The LAD is noted to have 50% stenosis and 60% stenosis in the early midportion of the LAD just beyond the takeoff of the first diagonal.  The first diagonal is a medium size vessel which bifurcates distally and has a 60% diffuse disease in its proximal segment.  Left coronary artery also has medium size ramus intermedius which bifurcates in the midsegment.  There is 60% midvessel stenosis.  Left circumflex is a medium size vessel which courses the AV groove and gives rise to one  obtuse marginal branch.  The AV groove circumflex has no significant disease.  The first OM is a medium size vessel with a 60% proximal stenosis.  The right coronary artery is a large vessel which is dominant and gives rise to both the PDA as well as a posterolateral branch.  There is no significant disease in the RCA, PDA, or posterolateral branch.  Left ventriculogram reveals a preserved EF at 60%.  There is no significant MR.  HEMODYNAMICS:  Systemic arterial pressure 106/52, LV systemic pressure 104/12, LVEDP of 16.  Following the conclusion of the case, the right femoral site was then closed using a Perclose device without complications.  1 gram of Ancef was given prophylactically.  CONCLUSION: 1. No significant coronary artery disease, however, the patient does have    multiple scattered 60% stenotic lesions. 2. Normal left ventricular systolic function. DD:  08/25/00 TD:  08/25/00 Job: [id number]

## 2010-07-17 NOTE — Discharge Summary (Signed)
[place]. [facility]  Patient:    Kurt [name], Kurt [name]                    MRN: [id number] Adm. Date:  [id number] Disc. Date: [id number] Attending:  Willow Ora CC:         Dr. Faustino Congress, [facility]   Discharge Summary  Discharged from [facility].  STAFF ATTENDING:  Asencion Partridge, M.D.  DISCHARGE DIAGNOSES: 1. Syncopal episode. 2. Hypertension. 3. Status post myocardial infarction in 1997.  DISCHARGE MEDICATIONS: 1. Aspirin 325 mg p.o. q.d. 2. Toprol XL 50 mg p.o. q.d. 3. Lipitor 10 mg p.o. q.d.  PROCEDURES DONE IN HOSPITAL:  Head CT is negative, carotid Dopplers normal, a 2-D echo negative and also an ejection fraction shown on the echo of 55-65%.  BRIEF ADMISSION AND PHYSICAL:  This is a 77 year old white male reported eating dinner and drinking three beers, when after standing up, the patient suddenly lost consciousness for approximately two minutes.  The patient hit the right side of his face when falling, according to the wife, the patients lost consciousness before he hit the floor.  There was no aura, no lightheadedness, no chest pain, no shortness of breath, no blurry vision and no focal neurological deficits.  There were no signs of seizure either.  The patient had never had an episode like this in the past.  PERTINENT PHYSICAL EXAM:  Temperature 97.7, pulse of 80, blood pressure 132/81, respiratory rate of 18, saturating on room air to 97%.  Heart: Regular rate and rhythm, no murmurs, no carotid bruits.  Respiratory:  CTA bilaterally.  Neurologic:  Grossly intact, no focal deficits.  LABORATORY DATA:  Cardiac enzymes were negative, CBC was normal, CMP was normal, CT of the head was negative, carotid Dopplers were normal.  A 2-D echo was negative.  Ejection fraction again 55-65%.  Fasting lipid panel, cholesterol 172, LDL of 105, HDL of 45, triglycerides of 110.  EKG showed left bundle branch block,  some T-wave inversion in some leads.  DISCHARGE SUMMARY BY DIAGNOSIS: 1. Syncope.  Given patients history of MI and cardiac origin, it was though,    however, cardiac enzymes were negative, carotid Doppler was negative, a 2-D    echo was negative, CT of the head also did not show any pathology.  Given    the patients left bundle branch block on EKG and history of MI, the    patient will have cardiolite as outpatient per cardiacs recommendation. 2. Cardiology.  Also given patients history of MI and borderline    hypertension, the patient will continue taking beta blocker and aspirin.    The patients blood pressure remained stable during hospital stay.  Lipitor    will also be started given patients history of MI, an LDL of 105,    cholesterol of 172, HDL of 45, triglycerides of 110.  The patient also has    been advised to make lifestyle changes including diet and exercise.  DISCHARGE CONDITION:  Stable.  [facility] 11:30 a.m.  DD: 04/26/00 TD:  04/26/00 Job: [phone]

## 2010-07-17 NOTE — Op Note (Signed)
Kurt [name], Kurt [name]             ACCOUNT NO.:  [id number]   MEDICAL RECORD NO.:  [id number]          PATIENT TYPE:  AMB   LOCATION:  ENDO                         FACILITY:  [facility]   PHYSICIAN:  Petra Kuba, M.D.    DATE OF BIRTH:  [birth year]   DATE OF PROCEDURE:  12/15/2004  DATE OF DISCHARGE:                                 OPERATIVE REPORT   PROCEDURE PERFORMED:  Esophagogastroduodenoscopy with biopsy.   ENDOSCOPIST:  Petra Kuba, MD.   INDICATION:  Questionable history of Barrett's due for a repeat screening.  Consent was signed prior to any pre-meds given after risks, benefits,  methods, and options thoroughly discussed in the past.   MEDICATIONS:  Additional medicines for this procedure:  1.  Fentanyl 25 mcg.  2.  Versed 2.   PROCEDURE:  The video endoscope was inserted by direct vision.  The  esophagus was normal.  No signs of Barrett's were seen.  He did have a small  hiatal hernia and a widely patent, thin ring.  The scope was advanced in the  stomach and advanced through a normal antrum and a normal pylorus into a  normal duodenal bulb and around the __________ to a normal second portion of  the duodenum.  The scope was withdrawn back to the bulb, and a good look  there ruled out abnormalities at that location.  The scope was withdrawn  back to the stomach and retroflexed.  High in the cardia, the hiatal hernia  was confirmed.  The fundus, angularis, lesser and greater curve were normal  and retroflexed in straight visualization.  The scope was then slowly  withdrawn.  Again, the esophagus looked normal with no signs of Barrett's,  but we went ahead and took some additional esophageal biopsies above the  ring just to make sure no microscopic Barrett's.  The scope was removed.  The patient tolerated the procedure well.  There was no obvious, immediate  complication.   ENDOSCOPIC DIAGNOSES:  1.  Small hiatal hernia with a widely patent, thin, fibrous ring, status     post esophageal Barrett's.  No signs of Barrett's seen.  2.  Otherwise within normal limits esophagogastroduodenoscopy.   PLAN:  1.  Await pathology.  2.  Continue Nexium.  3.  GI followup p.r.n.          ______________________________  Petra Kuba, M.D.    MEM/MEDQ  D:  12/15/2004  T:  12/15/2004  Job:  [id number]   cc:   [facility]

## 2010-07-17 NOTE — Discharge Summary (Signed)
[name], [name]             ACCOUNT NO.:  [id number]   MEDICAL RECORD NO.:  [id number]          PATIENT TYPE:  INP   LOCATION:  5011                         FACILITY:  [facility]   PHYSICIAN:  Asencion Partridge, M.D.     DATE OF BIRTH:  1931-03-15   DATE OF ADMISSION:  05/17/2005  DATE OF DISCHARGE:  05/21/2005                                 DISCHARGE SUMMARY   CONSULTATIONS:  1.  Dr. Janalyn Shy P. Pearlean Brownie, neurology.  2.  Dr. Jenne Campus, cardiology.  3.  PT/OT.  4.  Rehab medicine.   DISCHARGE DIAGNOSES:  1.  New right posterior cerebral artery stroke.  2.  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  3.  Coronary artery disease, status post coronary artery bypass graft.  4.  Hyperlipidemia.  5.  Hypertension.  6.  Osteoarthritis.  7.  Status post pacemaker placement.  8.  Gastroesophageal reflux disease.   DISCHARGE MEDICATIONS:  1.  Zocor 40 mg p.o. daily.  2.  Aggrenox one capsule p.o. b.i.d.  3.  Protonix 40 mg daily.  4.  Toprol XL 50 mg p.o. daily.  5.  Lexapro 10 mg p.o. daily.  6.  Altace 2.5 mg p.o. daily.  7.  Ultram 50 mg p.o. daily.  8.  __________ p.o. daily as needed.  9.  Tylenol 650 mg p.o. q.6h. p.r.n. pain.   PROCEDURE:  1.  Carotid ultrasound showed panendarterectomy sites bilaterally with no      ICA stenosis identified. Vertebral artery flow was antegrade      bilaterally.  2.  TEE on May 19, 2005, showed mild LVH with normal systolic flow with      estimated ejection fraction of 60%.  There was a small PFO noted with      mild left to right and right to left shunting. No intracardiac mass or      thrombi were noted.  3.  Head CT on May 17, 2005, showed chronic small vessel ischemic changes      which were unchanged from prior CT. Negative for hemorrhage.  4.  CT of head on May 20, 2005, showed no acute findings, chronic      microvascular ischemic changes seen again.  5.  Chest x-ray showed postoperative changes of coronary artery bypass graft  with some atelectasis of the left base. No pneumothorax or edema. Stable      dual-lead pacer system.   ADMISSION LABORATORY:  White blood cell count 8.6, hemoglobin 12.6,  hematocrit 35.4. Sodium 132, potassium 4.6, chloride 102, bicarbonate 25,  BUN 18, creatinine 1.1, glucose 109, total bilirubin 1.1, alkaline  phosphatase 93, AST 24, ALT 14, total protein 7.1, albumin 3.6. PT 13.5,  INR 1.0. Urinalysis showed large blood with numerous red blood cells. Repeat  urinalysis was negative for blood. The patient also had heme negative stool  during hospitalization.   BRIEF HISTORY OF PRESENT ILLNESS:  Kurt [name] is a pleasant 77 year old  white male, status post CABG on April 29, 2005, who developed postoperative  left-sided CVA associated with right-sided weakness. He was discharged on  May 06, 2005, with home  physical therapy. He was doing well until the day  of admission with increasing dizziness as well as left-sided numbness,  weakness, and ataxia. He was also noted to have some dysphagia per family  members. Head CT done in the ER showed no acute bleed. The patient was  admitted and seen by family practice teaching service to evaluate stroke.   HOSPITAL COURSE:  Problem #1:  Stroke. Patient with left-sided weakness,  ataxia, paresthesia, as well as some dysphagia. He was also noted on  physical exam to have a left-sided homonymous hemianopsia.  Neurology  consult was obtained. Dr. Pearlean Brownie recommended carotid Doppler as well as a TEE  and repeat head CT. Carotid ultrasound was normal. TEE did not show any  cardiogenic thrombi. However, it did show PFO. At the time it was still  undecided whether or not closure of this PFO is necessary. This will be  decided in the weeks ahead. The patient had a repeat head CT done on April 30, 2005, which showed no changes from previous head CT. The patient was  started on Aggrenox one capsule b.i.d. He had been on aspirin, but failed  this. The  patient will also continue aggressive treatment of risk factors  for stroke such as hypertension and hyperlipidemia.   Problem #2:  Status post CABG. The patient was on moderate telemetry. No  abnormal rhythms were found.   Problem #3:  Hypertension. The patient continued on Toprol XL 50 mg as well  as Altace 2.5 mg p.o. The patient's blood pressure was at goal throughout  the entire hospitalization.   Problem #4:  Hyperlipidemia. The patient's LDL was 73 on previous hospital  admission in March. The patient was continued on Zocor 40 mg p.o. once  daily.   Problem #5:  GERD. Patient continued on Protonix 40 mg p.o. once daily.   Problem #6:  Anxiety. Patient with a lot of stressors in his life. He was  very nervous and at times tearful. The patient was started on Lexapro 10 mg  p.o. once daily to help with his anxiety. The patient also was given Ativan  0.5 mg p.r.n. which he needed once for anxiety.   Problem #7:  Osteoarthritis. Patient with osteoarthritis in his back. The  patient was continued on his home regimen of Ultram p.r.n. as well as  Tylenol p.r.n.   Problem #8:  Hematuria. The patient was found to have a large amount of  blood as well as red blood cells on initial urinalysis. The patient had  noted some hematuria prior to admission that had just recently started. The  patient has never been told he had hematuria before. A prostate exam was  done which showed a normal size prostate with no masses. A repeat urinalysis  was done which showed no hematuria. Would recommend repeating urinalysis as  an outpatient and conducting proper evaluation of hematuria present again.   Problem #9:  Anemia. The patient's hemoglobin was 11.0 at the time of  discharge. This is consistent with his hemoglobin at the time of discharge  from his discharge. The patient did have a rectal exam done and was heme  negative.  DISPOSITION:  The patient will be discharged to rehab hospital. He  will  follow up with Dr. Seleta Rhymes at [facility], [phone],  after discharge from rehab. Followup issues include follow up of hematuria  as well as continue physical therapy for stroke as well as decision or  whether or  not closure of PFO is necessary.      Benn Moulder, M.D.    ______________________________  Asencion Partridge, M.D.    MR/MEDQ  D:  05/20/2005  T:  05/22/2005  Job:  [id number]   cc:   Pramod P. Pearlean Brownie, MD  Fax: [phone]   Darlin Priestly, MD  Fax: [phone]   Evelene Croon, M.D.  [address]  [place]  [place] [address]

## 2010-07-17 NOTE — Procedures (Signed)
[facility]  Patient:    Kurt [name], Kurt [name]                    MRN: [id number] Adm. Date:  [id number] Disc. Date: [id number] Attending:  Nelda Marseille CC:         Petra Kuba, M.D.             [facility]                           Procedure Report  PROCEDURE:  Esophagogastroduodenoscopy with biopsy.  ENDOSCOPIST:  Petra Kuba, M.D.  INDICATION:  Patient with questionable Barretts on previous endoscopy, due for repeat screening, currently asymptomatic.  Consent was signed after risks, benefits, methods and options were thoroughly discussed in the past.  MEDICINES USED:  Demerol 50 mg, Versed 5 mg.  DESCRIPTION OF PROCEDURE:  The videoendoscope was inserted by direct vision. There was no obvious Barretts in his esophagus visually.  He did have a tiny hiatal hernia and when he belched, a widely patent fibrous ring was apparent. At the end of the procedure, we took some scattered biopsies from the GE junction, again just to rule out any metaplasia.  Scope was inserted into the stomach where some streaks of blood were seen and advanced through a normal antrum, normal pylorus, into a normal duodenal bulb and around the C-loop to a normal second portion of the duodenum.  Scope was withdrawn back to the bulb and a good look there ruled out abnormalities ______ .  Scope was withdrawn back into the stomach and retroflexed.  The angularis was normal.  Proximally, on the greater curve, three tiny-to-small gastric ulcers were seen with some black spots but no visible vessels.  They were all biopsied on retroflexion without significant bleeding.  The cardia and fundus were normal.  High in the cardia, the hiatal hernia was confirmed.  Distally on retroflexion, no additional findings were seen.  The scope was straightened and straight visualization in the stomach was normal except for above-mentioned proximal ulcers.  The biopsies of first  the ulcers on retroflexion were obtained and then of the GE junction were obtained and put in the second container.  On slow withdrawal back through the esophagus, no additional findings were seen. There was no significant esophagitis or other abnormality.  Scope was removed. Patient tolerated procedure well.  There was no obvious immediate complication.  ENDOSCOPIC DIAGNOSES: 1. Tiny hiatal hernia with a widely patent fibrous ring. 2. No obvious Barretts, status post biopsy of the gastroesophageal junction. 3. Proximal gastric ulcers, tiny to small, status post biopsy, looked like    aspirin- or nonsteroidal-induced. 4. Otherwise within-normal-limits esophagogastroduodenoscopy.  PLAN:  We will put him on Prilosec for a few months, cautioned him about aspirin and nonsteroidals, although I believe he is on one a day for his heart.  I will see him back in the office in six to eight weeks to recheck symptoms, probably guaiacs and determine any other workup and plans and have him call me sooner p.r.n. and await endoscopic biopsies on the Barretts to see if repeat Barrett screening is needed. DD:  07/14/99 TD:  07/16/99 Job: [id number]

## 2010-07-17 NOTE — H&P (Signed)
Kurt [name], Kurt [name]             ACCOUNT NO.:  [id number]   MEDICAL RECORD NO.:  [id number]          PATIENT TYPE:  INP   LOCATION:  3729                         FACILITY:  [facility]   PHYSICIAN:  Asencion Partridge, M.D.     DATE OF BIRTH:  1932-01-08   DATE OF ADMISSION:  05/17/2005  DATE OF DISCHARGE:                                HISTORY & PHYSICAL   CARDIOLOGIST:  Dr. Jenne Campus with [facility].   NEUROLOGIST:  Dr. Pearlean Brownie   CARDIOTHORACIC SURGEON:  Dr. Laneta Simmers   CHIEF COMPLAINT:  Left-sided weakness.   HISTORY OF PRESENT ILLNESS:  Kurt [name] is a pleasant 77 year old  Caucasian male status post CABG on April 29, 2005, who developed a  postoperative left-sided CVA associated with right side weakness. He was  discharged home on May 06, 2005, with home physical therapy and was doing  well until this morning when he awoke with increasing dizziness as well as  left-sided numbness, weakness, and ataxia. He denies any vision changes,  dysphagia, headache, or new right-sided symptoms. His family does think his  speech may be slightly slurred, although his mouth is very dry. He denies  any chest pain, shortness of breath, or palpitations. The patient's baseline  before today included that he was ambulating with a walker. He was able to  lift the walker up above the ground with both his arms and he was able to  feed himself with his right hand. The patient was continuing to improve. He  had yet to follow up with any physicians or physical therapy since his  discharge from the hospital.   REVIEW OF SYSTEMS:  No fevers, no chills, no nausea, no vomiting, no  diarrhea, no palpitations, no melena, no hematochezia, no abdominal pain, no  chest pain, no shortness of breath, no edema, no recent illnesses.   PAST MEDICAL HISTORY:  1.  Coronary artery disease status post CABG on April 29, 2005.  2.  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  3.  Hypertension.  4.  Hyperlipidemia.  Of note, LDL was 71 on May 01, 2005.  5.  Status post pacemaker placement 3 years ago.  6.  Osteoarthritis.  7.  GERD.   MEDICATIONS:  1.  Aspirin 325 mg p.o. daily.  2.  Protonix 40 mg p.o. daily.  3.  Toprol-XL 50 mg p.o. daily.  4.  Zocor 40 mg p.o. daily.  5.  Altace 2.5 mg p.o. daily.  6.  Ambien 5 mg p.o. q.h.s. p.r.n.  7.  Ultram 50-100 mg p.o. q.6h. p.r.n. pain.   ALLERGIES:  No known drug allergies.   FAMILY HISTORY:  Mom died as well as several brothers with history of  strokes. He also has a brother with colon cancer.   SOCIAL HISTORY:  He works as an Psychologist, clinical. He denies any history  of tobacco use. He does note occasional alcohol use, although none recently.  He is married with two children who are both healthy.   PHYSICAL EXAMINATION:  VITAL SIGNS:  Temperature 97.3, pulse 91, blood  pressure 133/89, respiratory rate 20, he is 96%  on room air.  GENERAL:  Pleasant, alert. He does have some dizziness while sitting up.  HEENT:  Sclerae are anicteric. Pupils are equally round and reactive to  light and accommodation. Extraocular motion is intact. Mucous membranes are  dry. Throat is without erythema or exudate.  NECK:  No carotid bruits, no JVD, no thyroid nodules or thyromegaly.  CARDIOVASCULAR:  Regular rate and rhythm. No murmurs, rubs, or gallops.  CHEST:  Positive scars from his CABG surgery but they are healing well.  LUNGS:  Clear to auscultation bilaterally. No increased work of breathing.  ABDOMEN:  Positive bowel sounds, soft, nontender, nondistended. No  hepatosplenomegaly.  EXTREMITIES:  No clubbing, cyanosis, or edema; 2+ dorsalis pedis pulses  bilaterally, 2+ radial pulses bilaterally.  NEUROLOGIC:  He is alert and oriented x3; 5/5 upper and lower extremity  strength bilaterally symmetric. Sensation is intact to light touch  bilaterally. Finger-to-nose is intact bilaterally. His heel-to-shin movement  is intact. No pronator drift. DTRs 2+  bilaterally symmetric. Toes are  downgoing. Gait and Romberg were not tested.   LABORATORY DATA:  White blood cell count 8.6, hemoglobin 12.6, hematocrit  35.4, platelet count 552, MCV 96, 86% neutrophils. Sodium 132, potassium  4.0, chloride 102, bicarb 25, BUN 18, creatinine 1.1, glucose 109, AST 24,  ALT 14, alk phos 93, total bilirubin 1.1, albumin 3.6, total protein 7.1. PT  13.5, PTT 37, INR 1.0. CT head showed no acute intracranial findings, did  show chronic microvascular white matter disease. Chest x-ray showed left-  sided atelectasis and scarring but no edema or pneumothorax.   ASSESSMENT AND PLAN:  A 77 year old male status post coronary artery bypass  grafting with postoperative left-sided cerebrovascular accident now with  left-sided ataxia and numbness.   1.  Neurologic. I am concerned for new right-sided cerebrovascular accident      versus transient ischemic attack. The patient was on aspirin so likely      will need to start Aggrenox. The patient had a preoperative evaluation      prior to his CABG on March 1 which included a normal echocardiogram as      well as carotid Dopplers but he did not have any further testing after      his stroke. As a result, we will likely obtain these studies. The      patient is not eligible for MRI as he has a pacemaker. We will consult      the patient's neurologist, Dr. Pearlean Brownie. We will also check orthostatic      vital signs and give fluids if necessary as the patient is dizzy on      sitting.  2.  Status post coronary artery bypass grafting. The patient denies any      chest pain or signs or symptoms of congestive heart failure. We will      admit him to telemetry for observation. We will also touch base with Dr.      Jenne Campus to let him know that his patient has been admitted.  3.  Hypertension. Will continue the patient's Altace 2.5 mg as well as     Toprol-XL 50 mg and will continue to monitor his blood pressures.  4.   Hyperlipidemia. LDL currently close to goal at 71. Will continue Zocor      40 mg p.o. daily.  5.  Gastroesophageal reflux disease. Will continue Protonix.  6.  Deep venous thrombosis prophylaxis. Will start prophylactic dose of      Lovenox  until the patient is ambulatory.      Benn Moulder, M.D.    ______________________________  Asencion Partridge, M.D.    MR/MEDQ  D:  05/17/2005  T:  05/18/2005  Job:  [id number]   cc:   Darlin Priestly, MD  Fax: [phone]   Pramod P. Pearlean Brownie, MD  Fax: [phone]   Evelene Croon, M.D.  [address]  [place]  [place] [address]

## 2010-07-17 NOTE — Consult Note (Signed)
NAMEHERSCHELL, Kurt [name]             ACCOUNT NO.:  [id number]   MEDICAL RECORD NO.:  [id number]          PATIENT TYPE:  INP   LOCATION:  2550                         FACILITY:  [facility]   PHYSICIAN:  Genene Churn. Love, M.D.    DATE OF BIRTH:  06-21-1931   DATE OF CONSULTATION:  12/29/2005  DATE OF DISCHARGE:                                   CONSULTATION   This 77 year old right-handed white married male is seen in the recovery  room postop for evaluation of medications with history of stroke and the  need for Coumadin therapy following left knee total replacement.   HISTORY OF PRESENT ILLNESS:  Kurt [name] has a known history of  hypertension and coronary artery disease.  He is status post LIMA to LAD,  saphenous vein bypass graft to diagonal, saphenous vein bypass graft to  posterior descending artery and right coronary artery, saphenous vein bypass  graft to the first and second obtuse marginals of the left circumflex in  March 2007.  This surgery was complicated by an intraoperative left MCA  stroke April 29, 2005, with right-sided weakness.  Two weeks later, he also  developed left-sided weakness with visual field cut and left-sided numbness  from a right PCA stroke.  He was seen by Dr. Pearlean Brownie November 24, 2005, for  clearance preoperatively for the left total knee replacement.  He has been  on Aggrenox 25/200 b.i.d., and now Coumadin is to be added.   MEDICATIONS AT HOME:  1. Metoprolol, which was 50 but now I believe is 100 mg b.i.d.  2. Aggrenox 25/200 b.i.d.  3. Zocor 40 daily.  4. Metanx 1 daily.  5. Lexapro 20 daily.   PHYSICAL EXAMINATION:  GENERAL:  Well-developed, lethargic white male.  VITAL SIGNS:  Blood pressure lying right and left arm 140/65, heart rate of  71 with paced beats.  There were no bruits.  NEUROLOGIC:  Mental status: He is a lethargic.  He is oriented x3. He  follows some one-step commands.  Cranial nerve examination reveals pupils  react from 3 to 2.5.  Both disks were poorly seen. The extraocular movements  were full.  Hearing was decreased.  He had a possible left superior  quadrantopia.  He not always get the numbers with hand finger testing  correctly in the left upper quadrant. His face was symmetric.  Tongue was  midline.  Uvula was midline.  Gags were present.  He moved his right arm,  his left leg, and his right leg very well.  He could move the toes of his  left foot. Deep tendon reflexes were 1+.  Plantar response was downgoing on  the right and left felt.  He felt pinprick in all extremities.   IMPRESSION:  1. 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  2. Documented patent foramen ovale.  3. Hypertension (Code 796.2).  4. Pacemaker (Code 429.2)  5. Coronary artery disease (Code 249.2)  6. Status post lumbar spine surgery x3 (Code 724.4)  7. Hyperlipidemia (Code 272.4).  At this time, I agree with the Aggrenox plus Coumadin therapy.  Need to  watch for GI bleed with his history of gastroesophageal reflux disease and  recommend covering with  Protonix.           ______________________________  Genene Churn. Sandria Manly, M.D.     JML/MEDQ  D:  12/29/2005  T:  12/29/2005  Job:  [id number]   cc:   Molly Maduro A. Thurston Hole, M.D.  [facility], Dr. Jenne Campus

## 2010-07-17 NOTE — Cardiovascular Report (Signed)
NAMEMOODY, ROBBEN             ACCOUNT NO.:  [id number]   MEDICAL RECORD NO.:  [id number]          PATIENT TYPE:  INP   LOCATION:  3742                         FACILITY:  [facility]   PHYSICIAN:  Darlin Priestly, MD  DATE OF BIRTH:  1931/10/20   DATE OF PROCEDURE:  05/19/2005  DATE OF DISCHARGE:                              CARDIAC CATHETERIZATION   PROCEDURE:  Transesophageal echocardiogram.   ATTENDING PHYSICIAN:  Darlin Priestly, M.D.   COMPLICATIONS:  None.   INDICATIONS FOR PROCEDURE:  Mr. Farrel is a 77 year old male patient of  Dr. Lenise Herald with a history of sick sinus syndrome status post  permanent pacer implant, history of recent coronary artery bypass surgery in  March 2007 with subsequent postop CVA.  He was readmitted on May 17, 2005,  with right sided weakness with initial head CT revealing no acute changes.  He is now referred for a transesophageal echocardiogram to rule out possible  intracardiac source of recurrent CVA.   DESCRIPTION OF PROCEDURE:  After giving informed written consent, the  patient was brought to the endoscopy suite in a fasting state.  The patient  then underwent successful and uncomplicated transesophageal echocardiogram.  The left ventricle appeared to be mildly thickened with normal LV systolic  function, estimated EF 60%.  There is mild septal dyskinesis secondary to  patient artifact.  Mildly thickened aortic valve with trivial aortic  regurgitation.  Mildly thickened mitral valve leaflets with trivial to mild  mitral regurgitation.  Tricuspid valve with trivial tricuspid regurgitation.  Trivial pulmonic regurgitation.  No intracardiac mass or thrombus noted.  There was a small patent foramen ovale noted with aneurysmal fossa ovalis.  There was mild left to right as well as right to left  shunting with color  and contrast echo.  Normal descending thoracic aorta.   CONCLUSION:  Mild concentric LVH with normal LV systolic  function estimated  at 60% with mild septal dyskinesis.  There was a small PFO noted with right-  to-left and left-to-right shunting.      Darlin Priestly, MD  Electronically Signed     RHM/MEDQ  D:  05/19/2005  T:  05/20/2005  Job:  [id number]

## 2010-07-17 NOTE — Consult Note (Signed)
Kurt [name], Kurt [name]             ACCOUNT NO.:  [id number]   MEDICAL RECORD NO.:  [id number]          PATIENT TYPE:  INP   LOCATION:  3711                         FACILITY:  [facility]   PHYSICIAN:  Deanna Artis. Hickling, M.D.DATE OF BIRTH:  1931/05/17   DATE OF CONSULTATION:  01/01/2006  DATE OF DISCHARGE:                                   CONSULTATION   CHIEF COMPLAINT:  Altered mental status.   HISTORY OF PRESENT CONDITION:  Kurt [name] is a 77 year old gentleman who  was admitted to [facility] for a left total knee replacement.  He  has done extremely well from a surgical standpoint and has been up walking.   The patient suffered a left middle cerebral artery distribution stroke and  left-sided weakness with a right visual field cut from a right posterior  cerebral artery stroke, during a 2-week interval in March2007.  The patient  had the initial stroke during coronary artery bypass grafting.  The decision  was made to place him on Aggrenox, despite the strong suspicion that he had  a cardiogenic embolus involving 2 totally different distributions of  vascular supply.   The patient has done well since that time.  He has been driving a car.  He  has exhibited some problems with short-term memory.  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   His wife has always done the finances.  The patient has not exhibited  significant signs of altered mental status, problems with language, or of  focal neurologic deficits over this time.   In the hospital, the patient exhibited from about 1700 hours yesterday until  now, significant periods of confusion.  He thought that he had walked to the  hospital.  He had the feeling that he was climbing the walls literally, and  it was raining outside.  There are other comments that he made that made no  sense, in the context that they were made.  There  are other times that he  seemed to be lucid and able to process the things that were going on about  him.  He had significant problems with memory and did not recognize that his  wife had been there several times daily, and has accused her of not being  there for a couple of days.  He has hadlittle slep in days.  He was seen  today by the orthopedic physician's assistant, who felt that he was oriented  to person but not to situation or place.  In this setting, I was asked to  see him, to determine the etiology of his dysfunction, to make  recommendations for further workup and treatment.   PAST MEDICAL HISTORY:  Is as described above.  Risk factors for stroke  include metoprolol.   X-RAYS:  Include hypertension, dyslipidemia, hyper-homocystinuria.  The  patient is also on Lexapro, I assume, for depression.   PAST SURGICAL HISTORY:  Includes pacemaker implantation, coronary artery  bypass graft, lumbar spine surgery x3.   MEDICATIONS:  On admission:  1. Metoprolol 100 mg twice daily.  2. Aggrenox 25/200 twice daily.  3. Zocor 40 mg daily.  4. Metanx 1 daily.  5. Lexapro 20 mg daily.   In hospital, the patient was on variety of medications including:  1. Cephazolin.  2. Coumadin.  3. Oxycodone.  4. Phenergan.  5. Hydromorphone PCA.   We actually saw him in the immediate postoperative period, to determine  whether or not Aggrenox should be continued with Coumadin.  It was decided  that it should.  Imdur was added to his medicines, in addition to Lovenox  which was subsequently  discontinued.   With altered mental status, the patient was taken off oxycodone, Phenergan,  Robaxin, Restoril and Vicodin.  He had not had them in over 24 hours.  Zocor  was changed to Crestor.   FAMILY HISTORY:  Both parents died at age 48 and had strokes.  The patient  has a brother who died with colon cancer.   SOCIAL HISTORY:  The patient lives with his wife.  He does not use tobacco.  He drinks  occasional alcohol.  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   EXAMINATION:  GENERAL:  On examination today, this is a well-developed, well-  nourished gentleman, pleasantly confused, sitting in the chair.  He is aware  of some of his deficits and embarrassed by them.  He is not showing active  hallucinations.  He is right-handed.  VITAL SIGNS:  Blood pressure 101/58, resting pulse 81, respirations 20,  temperature 96.9, oxygen saturation 95%, room air.  EAR, NOSE AND THROAT:  No bruits, infection or meningismus.  LUNGS:  Clear.  HEART:  No murmurs.  Pulses normal.  He has a pacemaker implant.  ABDOMEN:  Soft.  Bowel sounds normal.  No hepatosplenomegaly.  EXTREMITIES:  Left total knee replacement, no edema.  MENTAL STATUS:  The patient scores 19/30, 5/10 for orientation, 1/5 for  computation, 2/3 for recall, 3/3 for registration and 8/9 for language.  His  animal naming was 10.  He named the first 7 in 15 seconds, and then took the  next 45 to name 3.  The patient recognizes some of his deficits.  CRANIAL NERVES:  Round reactive pupils.  Visual fields are full.  Dr. Pearlean Brownie,  when he saw the patient preoperatively, also did not see a field cut, to  double simultaneous stimuli.  Extraocular movements were full.  Symmetric  facial strength, midline tongue and uvula.  Motor examination:  Normal  strength, tone and mass.  Good fine motor movements.  No drift.  Sensory  examination intact to stereognosis bilaterally.  He had a mild peripheral  neuropathy.  Cerebellar finger-to-nose was okay.  Rapid repetitive movements  were okay.  Gait was antalgic, and he needed help to walk.  He is in a brace  surrounding his left knee.  Deep tendon reflexes are diminished.  The  patient had bilateral flexor plantar responses.   IMPRESSION: 1. Delirium, multifactorial 293.0.  This is related to medications, sleep      deprivation, and is unlikely related to a  stroke or seizure.  2. The patient appears to have had mild cognitive dysfunction as result of      his strokes.  Currently, he is testing in the demented range in his      mini-mental status and animal naming.   If he truly has dementia, then showing delirium and sundowning in  the  hospital is not unusual.  If he is in a mild cognitive dysfunction, then it  is a bit more unusual.   PLAN:  Will perform a CT scan of the brain to evaluate for other strokes,  also obtain laboratories for B12, red blood cell folic acid, VDRL and TSH.  Because he is not acutely sleepy, or showing acute delirium at this time, I  see no reason to perform an ammonia test.   The patient should continue Lexapro, Aggrenox, Coumadin, and I agree with a  change to Crestor.  Depending on his condition, he may be able to home in  the morning, and he may very well improve in his cognitive function in a  home setting.   I appreciate the opportunity to participate in his care.  If you have any  questions, do not hesitate to contact me.      Deanna Artis. Sharene Skeans, M.D.  Electronically Signed     [facility]/MEDQ  D:  01/01/2006  T:  01/02/2006  Job:  [id number]   cc:   Molly Maduro A. Thurston Hole, M.D.  Pramod P. Pearlean Brownie, MD

## 2010-07-17 NOTE — Op Note (Signed)
Kurt [name], Kurt [name]             ACCOUNT NO.:  [id number]   MEDICAL RECORD NO.:  [id number]          PATIENT TYPE:  INP   LOCATION:  2918                         FACILITY:  [facility]   PHYSICIAN:  Elana Alm. Thurston Hole, M.D. DATE OF BIRTH:  Mar 12, 1931   DATE OF PROCEDURE:  12/29/2005  DATE OF DISCHARGE:                               OPERATIVE REPORT   PREOPERATIVE DIAGNOSIS:  Left knee degenerative joint disease.   POSTOPERATIVE DIAGNOSIS:  Left knee degenerative joint disease.   PROCEDURE:  Left total knee replacement using DePuy cemented total knee  system with #4 cemented femur, #5 cemented tibia, with 15 mm  polyethylene RP tibial spacer, and 38 mm polyethylene cemented patella.   SURGEON:  Elana Alm. Thurston Hole, M.D.   SURGEON:  Kirstin Shepperson, P.A.-C.   ANESTHESIA:  General.   OPERATIVE TIME:  1 hour 40 minutes.   COMPLICATIONS:  None.   DESCRIPTION OF PROCEDURE:  Kurt [name] was brought to the operating  room on December 29, 2005, after a femoral nerve block was placed by  anesthesia.  He was placed on the operating room table in a supine  position.  He received Ancef 1 gram IV preoperatively for prophylaxis.  After being placed under general anesthesia, he had a Foley catheter  placed under sterile conditions.  His left knee was examined.  Range of  motion from -5 to 125 degrees with moderate varus deformity and a stable  ligamentous exam with normal patellar tracking.  The left leg was  prepped using sterile DuraPrep and draped using sterile technique.  The  leg was exsanguinated and a thigh tourniquet elevated to 365 mmHg.  Initially through a 15 cm longitudinal incision based over the patella,  initial exposure was made.  The underlying subcutaneous tissues were  incised along the skin incision.  A median arthrotomy was performed  revealing an excessive amount of normal appearing joint fluid.  The  articular surfaces were inspected.  He had grade 4 changes medially,  grade 3 changes laterally, and grade 3 to 4 changes in the  patellofemoral joint.  Osteophytes were removed off the femoral condyles  and tibial plateau.  The medial and lateral meniscal remnants were  removed as well as the anterior cruciate ligament.  An intramedullary  drill was then drilled up the femoral canal for placement of the distal  femoral cutting jig which was placed in the appropriate amount of  rotation and the distal 11 mm cut was made.  The distal femur was then  sized.  A #4 was found to be the appropriate size.  A #4 cutting jig was  placed and then these cuts were made.  After this was done, the proximal  tibia was exposed.  The tibial spines were removed with an oscillating  saw.  An intramedullary drill was drilled down the tibial canal for  placement of the proximal tibial cutting jig which was placed in the  appropriate manner of rotation and the proximal 6 mm cut was made based  off the medial or lower side.  After this was done, then spacer blocks  were placed  to check balancing and 15 mm blocks were placed in flexion  and extension.  This gave excellent balancing, excellent stability, and  excellent correction of his flexion and varus deformities.  At this  point, the proximal tibia was again exposed.  The #5 tibial base plate  trial was placed with an excellent fit and a keel cut was made.  A #4  PCL box cutter was then placed on the distal femur and these cuts were  made.  After this was done, the #4 femoral trial was placed and with the  #5 tibial base plate trial and the 15 mm polyethylene RP tibial spacer,  the knee was reduced, taken through a range of motion from 0 to 125  degrees with excellent stability and excellent correction of his flexion  and varus deformities.  The patella was sized.  A resurfacing 10 mm cut  was made and three locking holes placed for a 38 mm polyethylene  patella.  The trial was placed.  Patellofemoral tracking was evaluated  and  this was found to be normal.  At this point, it was felt that all  the components were of excellent size and stability.  They were then  removed.  The knee was then jet lavage irrigated with 3 liters of  saline.  The proximal tibia was exposed and a #5 tibial baseplate with  cement backing was hammered into position with an excellent fit with  excess cement being removed from around the edges.  The #4 femoral  component with cement backing was hammered in position also with an  excellent fit with excess cement being removed from around the edges.  The 15 mm polyethylene RP tibial spacer was then placed on the tibial  baseplate.  The knee reduced, taken through a range of motion from 0 to  125 degrees with excellent stability and excellent correction of his  flexion and varus deformities.  The 38 mm polyethylene cement backed  patella was then placed in its position and held there with a clamp.  After this was done, patellofemoral tracking was again evaluated and  this was found to be normal.  At this point, it was felt that all  components were excellent size, fit, and stability.  The knee was  further irrigated with saline.  The tourniquet was then released.  Hemostasis was obtained with cautery.  The arthrotomy was then closed  with #1 Ethilon suture over two medium Hemovac drains.  The subcutaneous  tissues were closed with 0 and 2-0 Vicryl, the subcuticular layer closed  with 4-0 Monocryl.  Steri-Strips were applied.  Sterile dressings and a  long leg splint applied.  The patient was then awakened, extubated, and  taken to the recovery room in stable condition.  Needle and sponge  counts correct x2 at the end of the case.      Robert A. Thurston Hole, M.D.  Electronically Signed     RAW/MEDQ  D:  12/29/2005  T:  12/29/2005  Job:  [id number]

## 2010-07-17 NOTE — Cardiovascular Report (Signed)
NAME:  Kurt [name], Kurt [name]                       ACCOUNT NO.:  [id number]   MEDICAL RECORD NO.:  [id number]                   PATIENT TYPE:  INP   LOCATION:  2019                                 FACILITY:  [facility]   PHYSICIAN:  Darlin Priestly, M.D.             DATE OF BIRTH:  June 08, 1931   DATE OF PROCEDURE:  03/12/2002  DATE OF DISCHARGE:                              CARDIAC CATHETERIZATION   PROCEDURE:  Insertion of dual-chamber pacemaker.   ATTENDING PHYSICIAN:  Darlin Priestly, M.D.   INDICATIONS FOR PROCEDURE:  The patient is a 77 year old male patient of Dr.  Lenise Herald and [place] Day with a history of  hypertension, left bundle branch block, hyperlipidemia who has had recurrent  syncope dating back to 2000.  The patient has had negative head CTs, carotid  Dopplers and negative tilt table test and essentially normal 2-D  echocardiogram.  He did have a loop recorder inserted.  However, this became  infected after approximately  one week and was subsequently explanted.  The  patient recently has had recurrent syncope and was noted to have seven  second pause after carotid massage.  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   DESCRIPTION OF PROCEDURE:  After getting informed written consent, the  patient was brought to the cardiac catheterization lab in a fasting state.  The right anterior chest wall was shaved, prepped and draped in sterile  fashion.  ECG monitor was established.  1% lidocaine was used to anesthetize  the right subclavian area. Approximately 3 cm horizontal infraclavicular  incision was then performed in the lateral aspect just inferior to the right  clavicle.  Hemostasis was obtained with electrocautery.  Blunt dissection  was used to carry this down to pectoral fascia.  A 3 x 4 cm pocket was then  created over the left pectoral fascia and the hemostasis was established  with electrocautery.   Next, 1% lidocaine was then used to enter the  subclavian vein without complication and guide wires were then easily passed  into the SVC and right atrium.  Next, a 9-French dilating sheath was then  easily passed over the guide wire and the guide wire and dilator were then  removed.  Following this, a 58 cm Medtronic model number [id number], serial number  [id number], passive ventricular lead was then easily passed into the right  atrium.  The retaining guide wire was then reinserted through the sheath and  the peel away sheath was then removed. A second #9 [place] dilator sheath was  then passed over the retaining guide wire.  The guide wire and dilator were  then removed.  A second 53 cm Medtronic model number [id number], serial number [id number]  [id number] atrial lead was then easily passed into the right atrium.  The  retaining guide wire was then reinserted through the sheath and the sheath  was then peeled away.  The guide wire was then anchored to the sheath.  Next, the stylet was then removed from the ventricular lead and a J form was  placed on the stylet. The ventricular lead was the easily passed through the  tricuspid  valve and into the RV outflow tract.  The J stylet was then  removed and straight stylet was then positioned in ___________ lead.  The  lead was then  pulled back and allowed to prolapse into the  RV apex.  Thresholds were then determined.  Impedance was 715 ohms.  Threshold was 0.3  V at 0.4 msec.  R wave were sensed at 22 mV.  Current was 0.6 milliamps.  Ten volts were negative.  The atrial stylet wire was then pulled back and  the J curve was allowed to form on the preformed atrial lead.  This was then  easily positioned in the right atrial appendage and thresholds were again  determined.  Atrial lead impedance was 650 ohms.  Threshold was 0.6 V at 0.4  msec. P waves were 5.3 mV.  Current was 1.2 milliamps.  Ten volts were  negative as well.  These leads were then sutured into  place using two 2-0  silk sutures per lead.  The pocket was then copiously irrigated with 1%  gentamycin solution.  The leads were then connected in serial fashion to  Medtronic Kappa KDR 901, serial number [id number] generator and the head  screws were tightened.  Pacing was confirmed.  An anchoring suture was then  placed in the apex of the pocket.  The generator and leads were then easily  delivered into the pocket and the retaining suture was then sutured in  place.  The fascia was then closed running 2-0 Dexon.  The skin layer was  then closed using running 5-0 Dexon. Steri-Strips were applied.  The patient  was transferred to the recovery room in stable condition.   CONCLUSION:  Successful placement of a Medtronic Kappa KDR 901 generator  with passive atrial and ventricular Medtronic lead.                                               Darlin Priestly, M.D.    RHM/MEDQ  D:  03/12/2002  T:  03/12/2002  Job:  [id number]

## 2010-07-17 NOTE — H&P (Signed)
. [facility]  Patient:    Kurt [name], Kurt A. Visit Number: [id number] MRN: [id number]          Service Type: Attending:  Pearletha Furl. Alanda Amass, M.D. Dictated by:   Abelino Derrick, P.AdamsC.                           History and Physical  CHIEF COMPLAINT:  Wound complication.  HISTORY OF PRESENT ILLNESS:  Mr. Palos is a 77 year old male followed by Dr. Milus Glazier who saw Dr. Jenne Campus for a history of syncope.  He has had two episodes - one was in February and sounds like it may have been orthostatic. The second episode in July was while he was outside at work.  He has a left bundle-branch block.  He does have a history of a Cardiolite study and carotid Dopplers and a tilt-table and an echocardiogram.  These have all been unrevealing.  There was a mild abnormality on his Cardiolite which suggested possible anteroseptal wall ischemia; he does have a left bundle-branch block also.  Catheterization done revealed scattered 60% LAD lesions with normal LV function.  He did have a 60% diagonal lesion and a 60% OM lesion.  He did wear an event monitor for a month but he had no problems which wearing that.  He is employed as an Personnel officer and it was felt that it may be best to implant a loop recorder.  This was done December 02, 2000.  He was seen in follow-up December 09, 2000.  His wound looked stable at that time.  He came back on October 21 because he had some mild bleeding from the loop recorder site.  He had mentioned that he had had a scab on there the week before and when he came in on October 21 the scab was gone and there was no obvious dehiscence of the wound.  There was no evidence of infection at that time.  He was reassured. He actually saw Dr. Jenne Campus back after this on November 6 and was doing well at that time.  He has not had any significant arrhythmias or events.  He comes into the office today with apparent superior wound dehiscence.  The tip  of the loop recorder is now visible.  He has not have fever or chills or other signs of systemic infection.  Dr. Alanda Amass feels it is best if he is admitted.  The loop recorder will be explanted by Dr. Jenne Campus tomorrow.  PAST MEDICAL HISTORY:  Remarkable for mild hypertension.  He has had previous back surgery.  He has a left bundle-branch block.  CURRENT MEDICATIONS: 1. Toprol-XL 50 mg q.d. 2. Aspirin q.d.  ALLERGIES:  No known drug allergies.  SOCIAL HISTORY:  He is married, is a nonsmoker.  He has two children and four grandchildren.  One of his sons drives in the Crafts[employer] truck [employer] series.  He does work at [employer].  FAMILY HISTORY:  Remarkable for mother who died at 60 of a stroke.  Father also died at 70 of stroke.  He has one brother 63 who has history of prostate cancer.  There is no history of coronary disease.  REVIEW OF SYSTEMS:  Essentially unremarkable except for noted above.  He has not had fever or chills.  He has not had recurrent syncopal spells.  IMPRESSION: 1. Wound complication. 2. History of syncope, loop recorder implanted - December 02, 2000. 3. Moderate coronary disease  by cardiac catheterization - March 27, 2000. 4. Left bundle-branch block. 5. History of mild hypertension.  PLAN:  After discussion with Dr. Jenne Campus, it was felt it may be best to admit the patient for further evaluation.  On reviewing his labs, it is noted that his LDL was 157 in October and the office notes indicate that he was started on Lipitor 20 mg a day.  We will make sure he is on this, and if not, institute it. Dictated by:   Abelino Derrick, P.AdamsC. Attending:  Pearletha Furl. Alanda Amass, M.D. DD:  01/23/01 TD:  01/23/01 Job: [id number]

## 2010-07-17 NOTE — Cardiovascular Report (Signed)
[place]  Patient:    Kurt [name], Kurt [name] Visit Number: [id number] MRN: [id number]          Service Type: CAT Location: [facility] 28[address] 10 Attending Physician:  Darlin Priestly Dictated by:   Lenise Herald, M.D. Proc. Date: 12/02/00 Admit Date:  12/02/2000                          Cardiac Catheterization  PROCEDURE:  Reveal loop recorder insertion.  CARDIOLOGIST:  Lenise Herald, M.D.  COMPLICATIONS:  None.  ESTIMATED BLOOD LOSS:  Less than 10 cc.  INDICATIONS:  [name] is a 77 year old white male patient of Dr. Lenise Herald with history of recurrent syncope with a fall from approximately 10 feet.  The patient has had essentially normal head CT, carotid Dopplers, and cardiac catheterization revealing noncritical CAD with a normal EF.  He also had a negative tilt table test.  He has had negative vent recorders.  He is now scheduled for a loop recorder implant in attempt to capture any significant ventricular or atrial arrhythmias.  DESCRIPTION OF OPERATION:  After obtaining informed consent, the patient was brought to the cardiac catheterization lab in fasting state.  The left anterior chest was then mapped by [place] with Medtronic, and a suitable site for the loop recorder was determined.  His chest was then sterilely prepped and draped.  Approximately a 1.5 cm horizontal incision was then made approximately 2 cm left of the sternum.  Blunt dissection was then carried out down to the pectoral fascia.  Approximately a 1 cm x 2 to 3 cm length pocket was then created above the pectoral fascia.  Hemostasis was obtained.  Two anchoring silk sutures were then placed at the anterior portion of the pocket to the fascia layer.  The Reveal Plus, Model Number [id number], Serial Number [id number] was then delivered into the pocket and sutured into place. The subcutaneous tissue was then closed using running 2-0 Dexon.  The skin layer was  then closed using running 5-0 Dexon.  Steri-Strips were then applied.  Again, hemostasis was confirmed.  The patient was then transferred to the recovery room in stable condition.  CONCLUSIONS:  Successful implant, Reveal Plus, Model Number [id number], Serial Number [id number] loop recorder. Dictated by:   Lenise Herald, M.D. Attending Physician:  Darlin Priestly DD:  12/02/00 TD:  12/02/00 Job: [id number]

## 2010-07-17 NOTE — H&P (Signed)
Kurt [name], Kurt [name]             ACCOUNT NO.:  [id number]   MEDICAL RECORD NO.:  [id number]          PATIENT TYPE:  IPS   LOCATION:  4039                         FACILITY:  [facility]   PHYSICIAN:  Ranelle Oyster, M.D.DATE OF BIRTH:  [birth year]   DATE OF ADMISSION:  05/21/2005  DATE OF DISCHARGE:                                HISTORY & PHYSICAL   CHIEF COMPLAINT:  Left sided weakness and ataxia.   HISTORY OF PRESENT ILLNESS:  This is a 77 year old white male with a history  of coronary artery disease, status post CABG, on April 29, 2005, who then  subsequently suffered a right parietal infarct the next day.  The patient  was discharged to home, on May 06, 2005, but was admitted with dizziness  and left sided numbness and tingling.  Head CT, upon followup, was without  acute changes.  The patient, however, was felt to have a right sided PCA  cardioembolic infarct.  The patient was changed to Aggrenox for stroke  prophylaxis but was unable to tolerate this due to headaches.  On TEE, May 19, 2005, a PFO was found but closure was not indicated due to size.  The  patient continues with dizziness and problems with balance and coordination.  The patient complains of significant ataxia of the left arm and leg as of  today.   REVIEW OF SYSTEMS:  The patient reports insomnia, some reflux, shortness of  breath with exertion, lightheadedness but not frank vertigo, questionable  urinary hesitancy, although he tells me he is voiding and moving his bowels  regularly.   PAST MEDICAL HISTORY:  1.  Hypertension.  2.  Permanent pacemaker.  3.  Left hand surgery times three or four.  4.  Osteoarthritis.  5.  Back surgery x3.  6.  CABG as noted above.  7.  Renal calculi.   FAMILY HISTORY:  Positive for cancer and stroke.   SOCIAL HISTORY:  The patient is married and lives in a one-level house in  [place] with three steps to enter.  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  The patient was independent completely prior to  arrival.   MEDICATIONS:  Prior to arrival include aspirin, Toprol, Nexium, Celebrex,  Altace, benazepril, Zocor, Tylenol.   ALLERGIES:  None.   LABORATORY:  Include hemoglobin 12.6, white count 9.7, platelets 595,000.  Sodium 132, potassium 4.6, BUN and creatinine 18 and 1.1.   PHYSICAL EXAMINATION:  VITAL SIGNS:  Blood pressure is 124/84, pulse is 80,  respiratory rate is 12, temperature is afebrile.  GENERAL:  The patient is pleasant, no acute distress.  He is alert and  oriented x3.  EAR, NOSE, AND THROAT:  Unremarkable.  NECK:  Supple without JVD or lymphadenopathy.  CHEST:  Clear to auscultation bilaterally without wheezes, rales, or  rhonchi.  HEART:  Regular rate and rhythm without murmurs, rubs, or gallops.  EXTREMITIES:  Show trace edema on the right side, lower greater than upper.  Pulses are 2+.  SKIN:  Intact throughout.  ABDOMEN:  Soft nontender.  Bowel sounds are positive.  MUSCULOSKELETAL:  Cranial nerves II-XII revealed minimal facial weakness.  He may have had nystagmus when gazing to the left, although this was not  predominant.  Cognitively the patient was intact.  Reflexes are 1+ to 2+  bilaterally.  Sensation appeared near symmetrical.  He may have trace lack  of pinprick and light touch on the left side.  The patient was grossly  ataxic with the left limbs as well as the trunk today.  Judgment was fair  for basic tasks.  Orientation was good.  Memory was fair for short-term  info.  I did not assess him for long term information today.  Mood was  appropriate.  The remainder of his motor exam was 5/5 on the right side with  good coordination.   ASSESSMENT/PLAN:  1.  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  There are questionable      vestibular deficits as well, the patient reports more lightheadedness      than frank  vertigo.  Questionable sensory loss is noted as well.  Being      comprehensive inpatient rehab with physical therapy to assess mobility,      lower extremity strength, strengthening and transferring.  Occupational      therapy will assess for upper extremity use and activities of daily      living.  Speech will assess for cognition.  Nursing will assess for      bowel, bladder, skin, and medication issues.  Case management and social      work will follow for psychosocial needs.  Estimated length of stay is      two weeks.  Goals are supervision modified independent.  Prognosis is      fair to good.  2.  Pain management with as needed Tylenol.  3.  Anticoagulation with subcutaneous Lovenox and Aggrenox.  4.  Hypertension.  Continue Altace and Toprol and monitor blood pressures.  5.  Mood.  Continue Lexapro for now.      Ranelle Oyster, M.D.  Electronically Signed     ZTS/MEDQ  D:  05/21/2005  T:  05/22/2005  Job:  [id number]

## 2010-07-17 NOTE — Procedures (Signed)
Kurt [name]. [facility]  Patient:    Kurt, [name] Visit Number: [id number] MRN: [id number]          Service Type: CAT Location: [id number] Attending Physician:  Darlin Priestly Dictated by:   Lenise Herald, M.D. Proc. Date: 01/23/01 Admit Date:  01/23/2001                             Procedure Report  PROCEDURE: Loop recorder explant.  COMPLICATIONS: None.  INDICATIONS: The patient is a 77 year old, white male, patient of Dr. Lenise Herald, with a history of left bundle-branch block, nonobstructive CAD, history of hypertension, history of recurrent syncope now status post negative tilt-table test and negative event monitors. The patient subsequently underwent loop recorder implant on December 02, 2000, without complication. The patient has been seen back in the office twice with subsequent followup interrogation with no events. The patient did have some mild bleeding from the incision site approximately one week after the implant but otherwise has done well. Apparently, the patient noticed some dehiscence of the incision site approximately 2 weeks ago; however, he did not contact anyone in our office. This has progressed to the point that he has now noted that the loop recorder is protruding from the incision site and he presented to the office today for a followup. He is subsequently admitted for loop recorder explant and surrounding cellulitis with no evidence of pocket infection.  DESCRIPTION OF PROCEDURE: After given informed written consent, the patient was brought to the cardiac catheterization lab where the loop recorder site was prepped and draped in a sterile fashion. After he was sedated, the proximum portion of the loop recorder was visible protruding slightly from the skin. Lidocaine 1% was then used to anesthetize the incision area.  The incision was slightly extended in a medial fashion and the entire proximal portion of the loop  recorder was visualized. The sutures were clipped and the loop recorder was easily removed. There was no evidence of purulent drainage present. The anchoring sutures were additionally removed and the area was debrided. There was minimal necrotic-appearing tissue. Again, there was no evidence of purulent drainage. Hemostasis was confirmed. The pocket was copiously irrigated with kanamycin solution. The patient had multiple cultures performed prior to the irrigation. IV Ancef was begun.  We then anesthetized a a very small area at the inferior aspect of his pocket and using hemostats as guidance made a small stab puncture through the inferior aspect of the pocket, which was then carried down with a small pair of mosquitoes extending into the inferior aspect of the pocket. A small 7 mm JP drain was then pulled into the pocket. This was trimmed to length and connected to a vacuum JP drain. The JP drain was then sutured into place with a 2-0 silk. The proximal pocket was then loosely approximated using Steri-Strips. OpSite was placed over the entrance wound of the JP drain. The patient was then transferred to the recovery room in stable condition.  CONCLUSIONS: Successful explant of loop recorder with insertion of JP drain through the inferior aspect of the pocket. Dictated by:   Lenise Herald, M.D. Attending Physician:  Darlin Priestly DD:  01/23/01 TD:  01/24/01 Job: [id number]

## 2010-07-17 NOTE — Assessment & Plan Note (Signed)
Kurt [name] is back regarding his right PCA stroke.  He still has  occasional balance problems, but is doing quite well at this point.  His  dizziness seems to be improving.  He has occasional episodes where his head  feels full for a few minutes, but then resolves.  He states that his blood  pressure has been consistent.  His main issue still appears to be his left  knee.  We tried the Bionicare knee which helped somewhat, but apparently due  to his electrical stimulation could not be continued with his pacer in  place.  He tried ultram at night which was not helpful.  He seemed to be  hesitant to use it during the day due to concerns about dizziness.  He does  wear a knee sleeve for support.  Uses his cane for balance and to take off  some of the weight on the leg.  The knee usually bothers him more after he  has been ambulating on it for a bit.  The patient has not seen Dr. Thurston [name]  back recently for any further intervention.  Potential surgery would not be  likely until next year.   REVIEW OF SYSTEMS:  Patient's pertinent positives listed above.  Full review  is in the health and history section.   SOCIAL HISTORY:  Without change today.   PHYSICAL EXAMINATION:  VITAL SIGNS:  Blood pressure 117/69, pulse is 86,  respiratory rate is 16.  He is saturating 95% on room air.  GENERAL:  Patient is pleasant, no acute distress.  He is alert and  appropriate.  Affect is bright.  NEUROLOGIC:  Gait is slightly antalgic to the left side.  He uses his cane  to unload the left leg.  Cranial nerve examination was stable.  He had good  weight shift today.  He was able to change directions without problems.  No  vertigo or ataxia was seen in a general sense.  He did have some ataxia on  the left arm which generally was much improved.  HEART:  Regular rate and rhythm.  LUNGS:  Clear.  ABDOMEN:  Soft, nontender.  EXTREMITIES:  Left knee was painful with manipulation.  He had a left knee  sleeve on  the knee.   ASSESSMENT:  1.  Right PCA stroke.  2.  Hypertension.  3.  Severe osteoarthritis of the left knee.   PLAN:  1.  Begin a trial of Ultram extended release 100 mg x4 days, then 200 mg      daily thereafter.  2.  Also will try Linbrel 500 mg b.i.d. which is a LOX-5 inhibitor.  There      should not be a tolerance or contraindication with this medication.  I      asked him to give it a full month's trial to see if he is feeling any      positive effects with the medication.  3.  Follow up with Dr. Thurston [name] for further surgical plans.  4.  I will see the patient back in about two months' time.      Kurt [name], M.D.  Electronically Signed     ZTS/MedQ  D:  09/28/2005 12:46:27  T:  09/28/2005 14:38:36  Job #:  [id number]   cc:   Kurt Priestly, MD  Fax: [phone]   Kurt [name], M.D.  Fax: [phone]

## 2010-07-17 NOTE — Assessment & Plan Note (Signed)
HISTORY OF PRESENT ILLNESS:  [name] is back regarding his right  posterior cerebral artery stroke.  He was discharged from rehab on June 07, 2005 and has been doing fairly well from a stroke standpoint.  His major  problem has been his left knee which has been extremely painful.  He reports  that his knee pain is a 7/10.  He has really had little in pain relief from  a medication standpoint.  Dr. Thurston Hole has been seeing him at the office for  Synvisc injections which helped initially but no longer are providing any  benefit.  The pain is worse when the patient is up walking.  He uses a cane  for stability.  He really is not wearing any special shoes.  Pain interferes  with general activity and quality of life on a 9-10/10 level.  Dr. Thurston Hole is  not sure if he is a surgical candidate due to medical risks involved.  There  is still a question about his patent foramen ovale which may need closure.  He is supposed to be meeting with cardiology and neurology in this regard.   REVIEW OF SYSTEMS:  The patient reports trouble walking, confusion,  dizziness.  In general, his neurological symptoms are much improved,  although he still has some decreased coordination on the left side.  Vision  is improving.  The patient denies any constitutional, GU, GI, cardiac, or  respiratory complaints today.   SOCIAL HISTORY:  The patient is married and wife remains very supportive.   MEDICATIONS:  1.  Toprol-XL 50 mg daily.  2.  Nexium 40 mg daily.  3.  Aggrenox daily.  4.  Multivitamin daily.  5.  Glucosamine daily.  6.  Simvastatin 40 mg daily.  7.  Tylenol Arthritis two tablets a day.   PHYSICAL EXAMINATION:  VITAL SIGNS:  Blood pressure 103/59, pulse 77,  respiratory rate 16, saturating 96% on room air.  NEUROLOGIC:  The patient is pleasant and in no acute distress.  He is alert  and oriented x3.  Affect is bright and appropriate.  Gait is stable.  His  cranial nerve exam was unremarkable.   He had minimal posterior lean today.  Cognitively, he was very appropriate and had good awareness and insight as  well as memory.  HEART:  Regular rate and rhythm.  CHEST:  Clear.  ABDOMEN:  Soft, nontender.  EXTREMITIES:  No clubbing, cyanosis, or edema.  Left knee was painful with  palpation and resisted movement today.  The patient walked with an antalgic  gait, favoring the left leg today.  He had continued left-sided ataxia.   ASSESSMENT:  1.  Right posterior cerebral artery stroke.  2.  Hypertension.  3.  Severe osteoarthritis of the left knee.   PLAN:  1.  The patient will continue therapy to completion.  2.  Will begin Ultram 50 mg q.6h. p.r.n. for pain control.  The patient may      be a candidate for controlled-release Ultram.  3.  The patient will continue his injection series per Dr. Sherene Sires office.  4.  I recommend that he continue glucosamine and Tylenol use.  5.  Will make a referral to [facility] for a Bionicare knee which is a      stimulated unit for the left knee which can help with pain and joint      health.  6.  Can consider further narcotics at a later date depending on course with  the current plan.  7.  Encourage proper shoe wear.  8.  I will see the patient back in about two months' time.      Ranelle Oyster, M.D.  Electronically Signed     ZTS/MedQ  D:  08/02/2005 16:07:23  T:  08/03/2005 12:03:04  Job #:  [id number]   cc:   Darlin Priestly, MD  Fax: [phone]   Elana Alm. Thurston Hole, M.D.  Fax: [phone]

## 2010-07-17 NOTE — Discharge Summary (Signed)
Kurt [name], Kurt [name]             ACCOUNT NO.:  [id number]   MEDICAL RECORD NO.:  [id number]          PATIENT TYPE:  IPS   LOCATION:  4039                         FACILITY:  [facility]   PHYSICIAN:  Ranelle Oyster, M.D.DATE OF BIRTH:  May 15, 1931   DATE OF ADMISSION:  05/21/2005  DATE OF DISCHARGE:  06/07/2005                                 DISCHARGE SUMMARY   DISCHARGE DIAGNOSES:  1.  Right posterior cerebral artery infarct.  2.  Hypertension.  3.  Situational depression.   HISTORY OF PRESENT ILLNESS:  Mr. Polyak is a 77 year old male with history  of coronary artery disease, CABG March 1 with left parietal infarct on  postop day 1.  DC'd to home March 8, readmitted March 9 with dizziness time  a few days and left-sided numbness and tingling.  CT of head demonstrated no  acute changes.  Dr. Sandria Manly was consulted for input in patient with right PCA  infarct, question cardioembolic.  TEE was ordered for full workup.  Carotid  Dopplers done showed no ICA stenosis.  TEE of March 21, showed no  intracardiac mass or thrombus.  Patient changed over to Aggrenox with CVA  prophylaxis.  A small PFO was noted on TEE.  Decision regarding closure in  the future per neurology and cardiology.  Patient was placed on subcu.  Lovenox for DVT prophylaxis; however, this was DC'd secondary to episode of  hematuria.  Patient currently complains of headache, as well as dizziness,  and poor positional awareness, decrease in balance, noted to have ataxia  with incoordination.  Rehab consulted for further therapies.   PAST MEDICAL HISTORY:  Significant for hypertension, permanent pacemaker,  left hand surgery x4 with weakness, OA, back surgeries x3, CABG with a  postop CVA, renal calculi.   ALLERGIES:  NO KNOWN DRUG ALLERGIES.   FAMILY HISTORY:  Positive for cancer and CVA.   SOCIAL HISTORY:  Patient is married, lives in a 1-level home with 3 steps at  entry; was working as an Psychologist, clinical  prior to CABG.  Does not use  any tobacco.  Uses alcohol occasionally.  Patient was independent with  walker 3 weeks prior to admission secondary to recent CABG.   HOSPITAL COURSE:  Mr. Ellwyn Ergle was admitted to rehab on May 21, 2005 for inpatient therapies to consist of PT, OT daily.  Last admission  subcu Lovenox was resumed for DVT prophylaxis secondary to decrease in  mobility.  Patient was maintained on Aggrenox b.i.d. and Tylenol was  scheduled 30 minutes prior to Aggrenox dose to help with headache.  Patient  was maintained on Lexapro for situational depression and lability.  Patient's blood pressure have been monitored on b.i.d. basis and are showing  good control on low-dose Altace.  At the time of discharge, blood pressure  is running from 100-120 systolic, 60-70 diastolic.  Heart rate stable  between 70s-80s.  O2 sats stable at 92-94%.   Labs done past admission revealed hemoglobin 12.0, hematocrit 33.8, white  count 6.7, platelets 508.  Check of lytes reveal sodium 135, potassium 3.9,  chloride 106, CO2 23,  BUN 16, creatinine 1.0, glucose 94.  LFT revealed  total protein 6.6, AST 22, ALT 14, alkaline phos 79, T bili 0.10.  Vestibular analysis showed patient had difficulty during smooth pursuit and  saccadic activity with mild ocular malalignment more significant secondary  to mild ptosis, right eye.  Patient had discoordination and ataxia leading  to decreased balance.  On March 29, there were concerns regarding patient  with decrease in balance with tendency to fall backwards and severe lean.  A  follow up CT of head was done showing stable white matter disease with small  stable lacunar-type basal ganglia infarct and remote infarct in left  periventricular white matter.  On March 29, during the day, the patient was  noted to be back to baseline.  No problems with tolerance of therapy.   Patient has had no complaints of chest pain.  He has been able to tolerate   therapy and mood has been stable.  Endurance is slowly improving.  At time  of discharge, patient's attention awareness is intact.  Verbal expression,  basic and high-level, is intact.  Patient able to express needs.  He was  able to write without difficulty with slight decrease in intelligibility.  He was noted to have moderate impairment in recalling complex verbal  information; this was improved to min assist by time of discharge.  At time  of discharge, patient needed close supervision to contact guard assist for  all aspects of ADLs limited by ataxia, decrease in postural alignment, and  decrease in balance reactiveness.  He is at supervision level for all  mobility.  He is able to transfer from bed to wheelchair with moderate  independence.  He is able to ambulate 180 feet x3 with a rolling walker with  supervision.  Wife will provide supervision as needed past discharge.  Patient also will continue with outpatient PT, OT, speech therapy at [facility] outpatient rehab beginning April 10.  On June 07, 2005, patient is  discharged to home.   DISCHARGE:  1.  Medications:  Nexium 40 mg a day, Altace 2.5 mg a day, Zocor 40 mg a      day, Lexapro 10 mg a day, Toprol-XL 50 mg a day, Aggrenox 1 p.o. b.i.d.,      Tylenol 325 mg 2 p.o. b.i.d., Metanx 1 p.o. per day.  2.  Diet is low-fat.  3.  Activity is as tolerated with the use of walker, supervision.  No      driving.  No strenuous activity.   FOLLOWUP:  Patient to follow up with Dr. Riley Kill May 8th, and follow up with  Dr. Claud Kelp in 1 month, follow up with Dr. Pearlean Brownie in 1 month, [facility] outpatient rehab June 08, 2005, at 9:15 a.m.      Greg Cutter, P.A.      Ranelle Oyster, M.D.  Electronically Signed    PP/MEDQ  D:  06/08/2005  T:  06/08/2005  Job:  [id number]   cc:   Dr. Jenne Campus   Dr Pearlean Brownie   Dr. Laneta Simmers   Dr. Mardelle Matte

## 2010-07-17 NOTE — Cardiovascular Report (Signed)
[name], MORE             ACCOUNT NO.:  [id number]   MEDICAL RECORD NO.:  [id number]          PATIENT TYPE:  OIB   LOCATION:  2899                         FACILITY:  [facility]   PHYSICIAN:  Darlin Priestly, MD  DATE OF BIRTH:  September 02, 1931   DATE OF PROCEDURE:  04/16/2005  DATE OF DISCHARGE:                              CARDIAC CATHETERIZATION   PROCEDURES PERFORMED:  1.  Left heart catheterization.  2.  Coronary angiography.  3.  Left ventriculogram.  4.  Abdominal aortogram.   ATTENDING:  Darlin Priestly, M.D.   COMPLICATIONS:  None.   INDICATIONS:  [name] is a 77 year old male patient of [facility] with history of cardiac catheterization October of 2002 with  scattered 60% lesions in his circumflex and LAD.  He also had recurrent  presyncope and ultimately underwent pacemaker implant on March 12, 2002  secondary to carotid sinus hypersensitivity.  He is now scheduled to undergo  knee replacement with Dr. Thurston Hole and part of preoperative clearance  underwent Cardiolite scan suggesting lateral wall ischemia.  He is now  brought for cardiac catheterization prior to his knee replacement.   DESCRIPTION OF OPERATION:  After giving informed written consent, patient  brought to the cardiac catheterization laboratory.  Right and left groin  shaved, prepped, and draped in usual sterile fashion.  ECG monitor  established.  Using a modified Seldinger technique, a #6-French arterial  sheath inserted in right femoral artery.  A 6-French diagnostic catheter was  then used to perform diagnostic angiography.   Left main is a large vessel with no significant disease.   The LAD is a large vessel coursing the apex, gave rise to one large  bifurcating diagonal.  The LAD has scattered sequential lesions 70% in the  early mid portion after the takeoff of the first diagonal, 60% mid, and 50  and 40% distal, mid, and early distal disease.   The first diagonal is  a medium to large sized vessel which bifurcates in the  mid segment with 70% proximal, 95% mid, and sequential 95% lesions in the  lower bifurcation.   Left circumflex is a medium sized vessel coursing the AV groove and gave  rise to three obtuse marginal branches.  The AV groove circumflex has no  significant disease.   The first OM is a medium sized vessel which bifurcates in the mid segment  with an 80% lesion in the lower bifurcation.  The second OM is a medium to  large sized vessel with an 80% proximal lesion.  The third OM is a small  vessel with no significant disease.   The right coronary artery is a large vessel which is dominant, gave rise to  PDA/posterolateral branch.  There is 30% proximal, 80% mid, and 30% distal  disease.  The PDA and posterolateral branch have no significant disease.   Left ventriculogram reveals a mildly depressed EF approximately 40% with  anterolateral and apical hypokinesis.  There is pacer artifact noted.   Abdominal aortogram reveals a tortuous iliac systems bilaterally; however,  there does not appear to be high-grade stenosis.  HEMODYNAMICS:  Systemic arterial pressure 131/62, LV systemic pressure  125/1, LVEDP of 3.   CONCLUSION:  1.  Significant three vessel coronary artery disease.  2.  Mildly depressed left ventricular systolic function and wall motion      abnormalities noted above.  3.  Tortuous iliacs bilaterally.      Darlin Priestly, MD  Electronically Signed     RHM/MEDQ  D:  04/16/2005  T:  04/16/2005  Job:  [id number]   cc:   [facility]

## 2010-07-17 NOTE — Op Note (Signed)
Kurt [name], Kurt [name]             ACCOUNT NO.:  [id number]   MEDICAL RECORD NO.:  [id number]          PATIENT TYPE:  INP   LOCATION:  2315                         FACILITY:  [facility]   PHYSICIAN:  Evelene Croon, M.D.     DATE OF BIRTH:  [birth year]   DATE OF PROCEDURE:  04/29/2005  DATE OF DISCHARGE:                                 OPERATIVE REPORT   PREOPERATIVE DIAGNOSES:  Severe three-vessel coronary artery disease.   POSTOPERATIVE DIAGNOSIS:  Severe three-vessel coronary artery disease.   OPERATIVE PROCEDURE:  1.  Median sternotomy.  2.  Extracorporeal circulation.  3.  Coronary artery bypass graft surgery x5; using a left internal mammary      artery graft to the left anterior descending coronary, with a saphenous      vein graft to the diagonal branch of the LAD, a saphenous vein graft to      the posterior descending branch of the right coronary artery, and a      sequential saphenous vein graft to the first and second obtuse marginal      branches of the left circumflex coronary artery.  4.  Endoscopic vein harvesting from the right leg.   ATTENDING SURGEON:  Evelene Croon, M.D..   ASSISTANT:  Salvatore Decent. Dorris Fetch, M.D.   SECOND ASSISTANT:  1.  Constance Holster, PA-C.  2.  Coral Ceo, P.A.   ANESTHESIA:  General endotracheal.   CLINICAL HISTORY:  This patient is a 77 year old gentleman with a history of  coronary disease, status post cardiac catheterization in October 2002. This  showed scattered 60% lesions in his left circumflex and LAD. 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 He has degenerative arthritis and was  scheduled to undergo a left knee replacement. A preoperative Cardiolite scan  showed anterolateral ischemia. He underwent catheterization on April 16, 2005, which showed severe three-vessel coronary artery disease. The LAD was  a large vessel with one large trifurcating  diagonal branch. The LAD had  scattered sequential lesions of 70% in the mid portion, after the takeoff of  the first diagonal branch. There is also about 60% mid-LAD stenosis. The  diagonal itself had three sub-branches. There was 70% proximal and 95% mid,  and sequential 90% lesions in the lower bifurcation. The left circumflex  gave rise to 3 marginal branches. The first had about 80% stenosis. The  second marginal had 80% stenosis. The right coronary artery was a dominant  vessel; had 80% mid-vessel stenosis. Left ventricular ejection fraction was  about 40%, with anterolateral and apical hypokinesis. There was no gradient  across the aortic valve.   After review of the angiogram and examination of the patient, it was felt  that coronary artery bypass graft surgery was the best treatment to prevent  further ischemia and infarction. I discussed the operative procedure with  the patient and his wife, including alternatives, benefits, and risks;  including bleeding, blood transfusion, infection, stroke, myocardial  infarction, graft failure, and death. They understood and agreed to proceed.   OPERATIVE PROCEDURE:  The patient was taken to the operating room and placed  on the table in the supine position. After induction of general endotracheal  anesthesia, a Foley catheter was placed in the bladder using sterile  technique. Then the chest, abdomen and both lower extremities were prepped  and draped in usual sterile manner. The chest was entered through a median  sternotomy incision. The pericardium was opened in the midline. Examination  of the heart showed good ventricular contractility. The ascending aorta had  no palpable plaques in it.   Then the left internal mammary artery was harvested from the chest wall as a  pedicle graft. This was a medium-caliber vessel with excellent blood flow  through it. At the same time, a segment of greater saphenous vein was  harvested from the  right leg using endoscopic vein harvest technique. This  vein was a large caliber of a good quality. There were a few varicosities in  the vein, and these were excluded.   Then the patient was heparinized, and when an adequate activated clotting  time was achieved the distal ascending aorta was cannulated using a 20-  [place] aortic cannula for arterial inflow. Venous outflow was achieved using  a 2-stage venous cannula for the right atrial appendage. An antegrade  cardioplegia and vent cannula was inserted in the aortic root.   The patient was placed on cardiopulmonary bypass and distal coronary was  identified. The LAD was a large vessel that had significant distal disease  in the mid and distal portions. The diagonal branch was diffusely diseased.  The first sub-branch was small. The second sub-branch was a moderate sized  vessel that had minimal distal disease in it. The third sub-branch was  diffusely diseased and relatively small distally beyond the disease. The 2  marginal branches were both moderate-sized vessels, with minimal distal  disease in them.   Then the aorta was crossclamped and 500 cc of cold blood antegrade  cardioplegia was administered in the aortic root; with quick arrest of the  heart. Systemic hypothermia to 20 degrees centigrade and topical hypothermic  iced saline was used. A temperature probe was placed in the septum;  insulating pad in the pericardium. Additional doses of cardioplegia were  given down the vein grafts and in the aortic root, approximately every 20  minutes, to maintain myocardial temperature around 10 degrees Centigrade.   The first distal anastomosis was performed to the first marginal branch. The  internal diameter was about 1.75 mm. Conduit used was a segment of greater  saphenous vein.  Anastomosis performed in a sequential side-to-side manner, using continuous 7-0 Prolene suture.  Flow was noted through the graft and  it was  excellent.   The second distal anastomosis was performed to the second marginal branch.  The internal diameter was also about 1.75 mm. The conduit used was the same  segment of greater saphenous vein.  The anastomosis was performed in a  sequential end-to-side manner, using continuous 7-0 Prolene suture.  Flow  was noted through the graft and it was excellent.   A third distal anastomosis was per formed to the diagonal branch. The second  sub-branch was grafted, since this was the largest vessel and had the least  disease in it. The internal diameter of this vessel was about 1.6 mm. The  conduit used was a segment of greater saphenous vein.  The anastomosis was  performed in an end-to-side manner, using continuous 7-0 Prolene suture.  Flow was noted through the  graft and it was good.   The fourth distal anastomosis was performed to the posterior descending  coronary artery.  The internal diameter was 1.75 mm. Conduit used was a  third segment of greater saphenous vein.  The anastomosis was performed in  an end-to-side manner, using continuous 7-0 Prolene suture. Flow was noted  through the graft and it was excellent.   The fifth distal anastomosis was performed to the distal portion of the left  anterior descending coronary near the apex. The internal diameter was about  1.75 mm in this area. The conduit used was a left internal mammary graft,  and this was brought through an opening in the left pericardium anterior to  the phrenic nerve. It was anastomosed to the LAD in an end-to-side manner  using continuous 8-0 Prolene suture. The pedicle was sutured to the  epicardium with 6-0 Prolene sutures. The patient was rewarmed to 37 degrees  Centigrade. With the crossclamp in place, the 3 proximal vein graft  anastomoses were performed to the aortic root in end-to-side manner using  continuous 6-0 Prolene suture. Then the clamp was removed from the mammary  pedicle. There was rapid warming  of the ventricular septum and return of  spontaneous ventricular fibrillation. The crossclamp was removed at a time  of 90 minutes. The patient defibrillated into sinus rhythm.   The proximal and distal anastomoses appeared hemostatic, and lie of the  graft satisfactory. Graft markers were placed around the proximal  anastomoses. Two temporary right ventricular and right atrial pacing wires  were placed and brought out through the skin.   When the patient had rewarmed to 37 degrees Centigrade, he was weaned from  cardiopulmonary bypass on no obvious impedance. Total bypass time was 107  minutes. Cardiac function appeared excellent, with cardiac output of 5  liters/min. Protamine was given.  The venous and aortic cannulas were  removed without difficulty. Hemostasis was achieved. Three chest tubes were  placed; with a tube in the posterior pericardium and one in the left pleural space, and one in the anterior mediastinum. The pericardium was loosely  reapproximated over the heart. The sternum was closed with #6 stainless  steel wires. The fascia was closed with continuous #1 Vicryl suture.  Subcutaneous tissue was closed with continuous 2-O Vicryl, and the skin with  3-0 Vicryl subcuticular closure. The lower extremity vein harvest site was  closed in layers in a similar manner. The sponge, needle and instrument  count was correct as reported by the scrub nurse.  Dry sterile dressings  were applied over the incisions around the chest tubes, which were placed to  Pleur-Evac suction. The patient remained hemodynamically stable and was  transported to the SICU in guarded but stable condition.      Evelene Croon, M.D.  Electronically Signed     BB/MEDQ  D:  04/29/2005  T:  04/29/2005  Job:  [id number]

## 2010-07-17 NOTE — Cardiovascular Report (Signed)
[place]. [facility]  Patient:    Kurt [name], Kurt [name]                    MRN: [id number] Proc. Date: 09/09/00 Adm. Date:  [id number] Attending:  Lenise Herald H CC:         Elvina Sidle, M.D.   Cardiac Catheterization  PROCEDURE PERFORMED:  Heads-up tilt table testing with Isuprel infusion.  ATTENDING:  Lenise Herald, M.D.  COMPLICATIONS:  None.  INDICATIONS:  Kurt [name] is a 77 year old white male, patient of Dr. Lenise Herald and Dr. Milus Glazier, with a history of syncope which has been recurrent.  He has had a negative head CT, carotid Dopplers, normal 2-D echocardiogram, and negative cardiac catheterization.  He is now referred for tilt-table testing to rule out vasodepressive syncope.  DESCRIPTION OF PROCEDURE:  After giving in formed written consent, the patient was brought to the EP laboratory in the fasting state.  The patients resting blood pressure was 143/74 and resting heart rate was 61.  Blood pressure remained stable throughout the entire procedure, with the lowest blood pressure ranging in the 128/79, pulse rate of 74.  The patient was then put back in the supine position after approximately 30 minutes and Isuprel infusion was begun at 0.5 mcg.  His blood pressure continued to remain stable, as did his heart rate.  The Isuprel was increased to 1 mcg.  The patient was then upright tilted again to 70 degrees with an initial fall in his blood pressure to 103/72, pulse rate of 84.  However, his blood pressure returned to normal at 125/76 within one minute.  He remained stable with no symptoms.  He was again put back in the supine position and the Isuprel was discontinued. Again, he had no significant changes in his heart rate or blood pressure.  He remained asymptomatic.  The patient was then taken off the table and transferred back to the recovery room in stable condition.  CONCLUSIONS:  Negative heads-up tilt-table testing with  Isopril infusion. DD:  09/09/00 TD:  09/09/00 Job: [id number]

## 2010-07-17 NOTE — Op Note (Signed)
Kurt [name], Kurt [name]             ACCOUNT NO.:  [id number]   MEDICAL RECORD NO.:  [id number]          PATIENT TYPE:  AMB   LOCATION:  ENDO                         FACILITY:  [facility]   PHYSICIAN:  Petra Kuba, M.D.    DATE OF BIRTH:  October 07, 1931   DATE OF PROCEDURE:  12/15/2004  DATE OF DISCHARGE:                                 OPERATIVE REPORT   PROCEDURE:  Colonoscopy with polypectomy.   INDICATIONS:  History of colon polyps due for repeat screening. Consent was  signed after risks, benefits, methods, options thoroughly discussed in the  office.   MEDICINES USED:  Fentanyl 75, Versed 5.   PROCEDURE:  Rectal inspection is pertinent for external hemorrhoids, small  digital exam was negative. Video colonoscope was inserted, easily advanced  the midtransverse. At that point there was some looping. Rolling him on his  back and various abdominal pressures we were able to advance to the cecum.  No abnormality was seen on insertion. The cecum was identified by the  appendiceal orifice and ileocecal valve. The scope was slowly withdrawn. The  prep was adequate. There is some liquid stool that required washing and  suctioning on slow withdrawal through the colon. Other than a small mid  transverse semi sessile polyp which was snared, electrocautery applied and  the polyp was suctioned through the scope and collected in the trap. No  other abnormalities were seen as we slowly withdrew back to the rectum.  Anorectal pull-through and retroflexion confirmed some small hemorrhoids.  Scope was straightened and readvanced short ways up the left side of colon.  Air was suctioned, scope removed. The patient tolerated the procedure well.  There was no obvious immediate complication.   ENDOSCOPIC DIAGNOSIS:  1.  Internal-external small hemorrhoids.  2.  Mid transverse small polyp snared.  3.  Otherwise within normal limits to the cecum.   PLAN:  Await pathology to determine future colonic  screening. Continue  workup with an EGD for his history of questionable Barrett's.           ______________________________  Petra Kuba, M.D.     MEM/MEDQ  D:  12/15/2004  T:  12/15/2004  Job:  [id number]   cc:   Olena Leatherwood [facility]

## 2010-07-17 NOTE — Discharge Summary (Signed)
[place]. [facility]  Patient:    Kurt [name], Kurt [name] Visit Number: [id number] MRN: [id number]          Service Type: CAT Location: 3700 [id number] Attending Physician:  Darlin Priestly Dictated by:   Abelino Derrick, P.A.C. Admit Date:  01/23/2001 Discharge Date: 01/24/2001   CC:         Elvina Sidle, M.D.   Referring Physician Discharge [place]  DISCHARGE DIAGNOSES: 1. Loop recorder extraction secondary to wound complications. 2. History of syncope; loop recorder implanted December 02, 2000. 3. Moderate coronary disease by catheterization in June 2002 with normal left    ventricular function. 4. History of left bundle-branch block. 5. History of hypertension. 6. History of hyperlipidemia, recently started on Lipitor.  HOSPITAL COURSE:  The patient is a 77 year old male followed by Dr. Milus Glazier who saw Dr. Jenne Campus for a history of syncope.  He has had two episodes - one was in February and one was in July.  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  Previous studies have included a Cardiolite, carotid Dopplers and tilt-table, and echo.  Cardiolite was mildly abnormal and he underwent diagnostic catheterization in June which revealed scattered 60% LAD lesions and a 60% diagonal lesion, and a 60% OM lesion with normal LV function.  An event monitor was placed but he had no problems during that period.  Since he is an Personnel officer and he works outside and some of his work is up on a ladder, we decided to proceed with a loop recorder implant. This was done December 02, 2000.  He was seen twice in follow-up since then and his wound site looked okay.  He presents January 23, 2001 saying he is having some bleeding from his recorder site.  On exam, his wound is open and the device is actually protruding.  He was admitted from the office January 23, 2001 for explant.  Recorder was interrogated in the office and showed  no events.  Late on November 25, Dr. Jenne Campus took him to the catheterization laboratory and removed the loop recorder.  A drain was placed in the inferior pocket of the wound.  Dr. Jenne Campus feels he can be discharged January 24, 2001 and will follow up tomorrow afternoon - Wednesday - at 3 p.m. in the office.  DISCHARGE MEDICATIONS: 1. Keflex 500 mg q.i.d. for 10 days. 2. Coated aspirin q.d. 3. Toprol-XL 25 mg a day. 4. Lipitor as taken prior to admission.  LABORATORY DATA:  On admission, sodium 139, potassium 4.7, BUN 13, creatinine 0.9.  INR 1.0.  White count 4.4, hemoglobin 14.3, hematocrit 41.5, platelets 224.  Differential was unremarkable.  Blood cultures were obtained and wound culture was obtained on admission, and the final reports on these are pending.  EKG shows sinus rhythm, left bundle-branch block.  DISPOSITION:  Patient is discharged in stable condition and will follow up in the office tomorrow afternoon with Dr. Jenne Campus. Dictated by:   Abelino Derrick, P.A.C. Attending Physician:  Darlin Priestly DD:  01/24/01 TD:  01/24/01 Job: [id number]

## 2010-07-17 NOTE — Discharge Summary (Signed)
NAMEJAKOTA, Kurt [name]             ACCOUNT NO.:  [id number]   MEDICAL RECORD NO.:  [id number]          PATIENT TYPE:  INP   LOCATION:  2009                         FACILITY:  [facility]   PHYSICIAN:  Evelene Croon, M.D.     DATE OF BIRTH:  12/15/1931   DATE OF ADMISSION:  04/29/2005  DATE OF DISCHARGE:  05/07/2005                                 DISCHARGE SUMMARY   ADDENDUM:  Mr. Campanaro has been awaiting rehab consult to see if he  qualifies for [facility].  On May 06, 2005, [facility] decided that the patient was  progressing too well to benefit from their floor.  They recommend the  patient go on home with physical and occupational therapy.  For the  patient's remaining hospital stay, he has remained stable and has been  progressing well.  He is ambulating and increasing his right hand  coordination.  The patient's vital signs remain stable.  His medications  remain the same, as do his discharge instructions.  The patient has a follow-  up appointment on Tuesday, March 27.  He is to follow up with Dr. Jenne Campus in  two weeks.  The patient will go home with home physical therapy and  occupational therapy.      Constance Holster, [place]      Evelene Croon, M.D.  Electronically Signed    JMW/MEDQ  D:  05/06/2005  T:  05/07/2005  Job:  [id number]   cc:   Darlin Priestly, MD  Fax: [phone]   Marolyn Hammock. Thad Ranger, M.D.  Fax: [phone]

## 2010-07-17 NOTE — Discharge Summary (Signed)
[name], Kurt [name]             ACCOUNT NO.:  [id number]   MEDICAL RECORD NO.:  [id number]          PATIENT TYPE:  INP   LOCATION:  2009                         FACILITY:  [facility]   PHYSICIAN:  Evelene Croon, M.D.     DATE OF BIRTH:  1931/05/01   DATE OF ADMISSION:  04/29/2005  DATE OF DISCHARGE:  05/05/2005                                 DISCHARGE SUMMARY   ADMITTING DIAGNOSES:  Three vessel coronary artery disease with abnormal  stress test.   DISCHARGE DIAGNOSES:  1.  Coronary artery disease status post coronary artery bypass grafting on      April 29, 2005.  2.  Postoperative cerebrovascular accident.  3.  Hypertension.  4.  Hyperlipidemia.  5.  History of permanent pacemaker placed about three years ago.  6.  Arthritis.  7.  Gastroesophageal reflux disease.   CONSULTS:  1.  On April 30, 2005 Dr. Marolyn Hammock. Reynolds was consulted from neurology by      Dr. Evelene Croon.  2.  On May 01, 2005 OT and PT were consulted by Dr. Evelene Croon.  3.  Dr. Lenise Herald followed the patient postoperatively for cardiology.   PROCEDURES:  1.  On April 29, 2005 the patient underwent a median sternotomy,      extracorporeal circulation.  Patient had coronary artery bypass graft      surgery x5 using a left internal mammary artery graft to the left      anterior descending coronary, saphenous vein graft to the diagonal      branch of the left anterior descending, a saphenous vein graft to the      posterior descending branch of the right coronary artery, and a      sequential saphenous vein graft to the first and second obtuse marginals      of the left circumflex coronary artery.  He also underwent endoscopic      vein harvesting from the right leg.  The procedure was done by Dr. Evelene Croon.  2.  On April 30, 2005 patient underwent a CT of the head without contrast.   HISTORY AND PHYSICAL EXAMINATION:  Kurt [name] was evaluated by Dr. Evelene Croon in the office.  He is a  77 year old gentleman with a history of  coronary artery disease status post cardiac catheterization October 2002  which showed scattered 60% lesions in his left circumflex and LAD.  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  A Cardiolite scan was performed  for preoperative clearance by Dr. Thurston Hole for a left knee replacement which  showed anterolateral ischemia.  The patient subsequently underwent cardiac  catheterization on April 16, 2005 which showed two vessel coronary artery  disease.  The LAD is a large vessel with one large bifurcating diagonal  branch.  The LAD has scattered sequential lesions 70% in the mid portion  after the takeoff of his first diagonal branch.  There was also about 60%  mid LAD stenosis.  There was 70% proximal, 95%  mid, and sequential 90%  lesions in the lower bifurcation of the diagonal branches.  The left  circumflex gave rise to three marginal branches.  The first marginal has an  80% stenosis.  The second marginal had 80% proximal stenosis.  The right  coronary artery was dominant vessel that has about 80% mid vessel stenosis.  There is diffuse 30% disease throughout the proximal portion.  Left  ventricular ejection fraction was mildly decreased at 40% with anterolateral  and apical hypokinesis.  There was no gradient across the aortic valve.   HOSPITAL COURSE:  Day of surgery the patient was doing well and was alert.  He remained afebrile.  Postoperative day #1 patient was A-paced and vital  signs remained stable.  His chest tube output was low and had no air leak.  Patient's laboratories were within normal limits.  The patient was  transferred to 2000 postoperative day #1.  On April 30, 2005 in the afternoon  the patient experienced some difficulty using his right arm.  Patient had  equal strength in his legs but his right grip was slightly weaker than the  left.   The patient subsequently went for a CT scan of the head which showed  no acute abnormality.  Neurology was consulted.  The patient underwent  another CT scan on May 01, 2005 which showed a subacute infarct of the left  parietal lobe.  Patient was experiencing some right-sided weakness still.  However, it has slightly improved throughout the patient's hospital stay.  The patient's coordination continues to improve.  Stroke team was consulted  on May 02, 2005.  Stated that the right upper extremity/lower extremity is  4/5; before he was 3/2.  Speech therapy was consulted.  The patient was  started on heparin for DVT prophylaxis since he has decrease in his  mobility.  The patient does cardiac rehabilitation as able.  It was thus  thought that the patient go to an inpatient rehabilitation for  deconditioning and for his postoperative CVA.   Overall, the patient appears to progress appropriately due to his condition.  Otherwise, the patient had no other complications.   PHYSICAL EXAMINATION:  VITAL SIGNS:  Patient is afebrile and his vital signs  are stable.  His O2 saturations are 91-96% on room air.  His weight is 219.  Preoperatively it was 220.  On telemetry the patient is paced.  GENERAL:  The patient is not in acute distress.  His right-sided strength is  fair and his coordination continues to improve.  LUNGS:  Decreased at the lower bases.  CARDIAC:  Regular rate and rhythm.  ABDOMEN:  Benign.  EXTREMITIES:  No edema.  Incisions are healing well, clear, dry, and intact.   CONDITION ON DISCHARGE:  Stable.   DISPOSITION:  Patient will be discharged to inpatient rehabilitation when a  bed is available.   MEDICATIONS:  1.  Aspirin 325 mg p.o. daily.  2.  Dulcolax 10 mg p.o. daily p.r.n.  3.  Tylenol 1000 mg p.o. every six hours p.r.n.  4.  Protonix 40 mg p.o. daily.  5.  Toprol XL 50 mg p.o. daily.  6.  Zocor 40 mg p.o. daily. 7.  Altace 2.5 mg p.o. daily.  8.  Niferex 150 mg  p.o. daily.  9.  Lovenox 40 mg subcutaneous daily.  10. Oxycodone 5-10 mg p.o. every three hours p.r.n.  11. Dulcolax suppository 10 mg per rectum daily p.r.n.  12. Ultram 50-100 mg p.o. every four hours  p.r.n.  13. Ambien 5 mg p.o. q.h.s. p.r.n.  14. Zofran 4 mg p.o. q.8h. p.r.n.   INSTRUCTIONS:  Patient was instructed to follow a low fat, low salt diet.  He is to continue treatment in rehabilitation for deconditioning after his  postoperative CVA.  Patient is to ambulate as able.  To continue his  incentive spirometry.  Patient is to do no heavy lifting or driving for  three weeks.   FOLLOW-UP:  A follow-up appointment will be made to see Dr. Laneta Simmers when the  patient is closer to being discharged.  The patient will follow up with  neurology closer to time of discharge also.      Constance Holster, [place]      Evelene Croon, M.D.  Electronically Signed    JMW/MEDQ  D:  05/04/2005  T:  05/04/2005  Job:  [id number]   cc:   Casimiro Needle L. Thad Ranger, M.D.  Fax: [phone]   Darlin Priestly, MD  Fax: [phone]

## 2010-07-17 NOTE — Consult Note (Signed)
[name], [name]             ACCOUNT NO.:  [id number]   MEDICAL RECORD NO.:  [id number]          PATIENT TYPE:  INP   LOCATION:  2009                         FACILITY:  [facility]   PHYSICIAN:  Casimiro Needle L. Reynolds, M.D.DATE OF BIRTH:  1931/08/18   DATE OF CONSULTATION:  04/30/2005  DATE OF DISCHARGE:                                   CONSULTATION   REQUESTING PHYSICIAN:  Evelene Croon, M.D.   REASON FOR EVALUATION:  Right-sided weakness.   HPI:  This is the initial inpatient consultation evaluation of this 77-year-  old man with a past medical history which includes coronary artery disease  for which he was admitted to the hospital for coronary artery bypass  grafting surgery on 04/29/2005.  He had a 5 vessel bypass which by all  accounts went well.  The family reports that when he woke up this morning  and was trying to eat breakfast he seemed to have difficulty using the fork.  Then when he was transferred to [place] from the ICU to the floor he required  assistance to get into the wheelchair and he was not ambulating very well.  In particular, he had weakness in his right arm and right leg.  He does not  have any nose or facial droop, speech changes or anything obviously wrong  with his right face.  He was assessed by cardiac rehab this afternoon and  was noted to have significant right sided weakness but required assistance  to stand and ambulate.  Concern about a possible stroke was raised and he  underwent a CT of the head which was available for my review and neurologic  consultation was subsequently requested.  The patient says he has never had  any stroke-like symptoms before.   PAST MEDICAL HISTORY:  He denies any history of hypertension to me, although  his chart indicates he does have a history of hypertension and  hyperlipidemia.  He also has had three lumbar spine surgeries.  He has a  remote history of a surgery on his left hand which left him with some  residual  weakness.  He had a pacemaker placed about 3 years ago.   FAMILY SOCIAL REVIEW OF SYSTEMS:  As outlined in the admission H&P by Dr.  Laneta Simmers on 04/20/2005, which is reviewed.   MEDICATIONS:  Prior to admission, he was taking aspirin, Toprol, Nexium,  Celebrex, glucosamine, Tylenol Arthritis and benazepril.  His present  regimen medication includes aspirin, Reglan which finished today, Dulcolax,  Tylenol, Lopressor, Protonix, Colace, Toradol which finished today,  cefuroxime, Pepcid and several p.r.n.'s.   PHYSICAL EXAMINATION:  VITAL SIGNS:  Temperature 98.0, blood pressure  114/63, pulse 72, respirations 18, O2 sat 94% on room air.  GENERAL EXAMINATION:  This is a healthy appearing man in no evident  distress.  HEAD:  Cranial nerves normocephalic and atraumatic.  Oropharynx benign.  NECK:  Supple without carotid bruits.  HEART:  Regular rate and rhythm with no murmur.  His incision is bandaged.  NEUROLOGICAL EXAM:  Mental status:  He is awake, alert and fully oriented.  He is able to name  objects and repeat phrases without difficulty.  His  speech is fluent and not dysarthric, mood is euthymic and affect  appropriate.  CRANIAL NERVES:  Pupils are equal and briskly reactive.  Extraocular  movements are full and without nystagmus.  Visual fields are full to  confrontation.  Hearing is intact to conversational speech.  Face, tongue  and palate move normally and symmetrically.  Shoulder shrug strength is  normal.  MOTOR TESTING:  Normal bulk and tone.  He has mild weakness of the finger  extensors on the left.  On the right, he has mild weakness of the finger  extensors and intrinsic muscles of the right hand.  In the lower  extremities, left is normal.  On the right, he has mild weakness of hip  flexion.  Foot dorsiflexion greater than plantar flexion and extension of  the toes.  SENSATION:  He reports diminished pinprick sensation over the right foot  compared to the left, otherwise  intact to light touch throughout.  COORDINATION:  Rapid movements performed slowly on the right.  CEREBELLAR:  Finger-to-nose is performed fairly well.  He does have some  heel-to-shin ataxia on the right side.  GAIT:  He stands with assistance.  He is unable to successfully bear his  weight on his right leg and needs a little bit of help burying weight on the  left.  He is able to sit without assistance.  REFLEXES:  Are 2+ and symmetric, goes are downgoing bilaterally.   LABORATORY REVIEW:  BMET is unremarkable except for an elevated glucose of  131.  CBC:  Hemoglobin 9.8, white cells 10.1, platelets 204,000.  I  personally reviewed the CT of the head performed today and this study is  remarkable for some old white matter disease but no evident acute finding.   IMPRESSION:  1.  Subacute subcortical left brain stroke, probably embolic and      perioperative, resulting in mild right hemiparesis, hemiataxia and      hemisensory changes.  2.  Recent coronary artery bypass grafting surgery April 29, 2005.  3.  History of pacemaker placement.   RECOMMENDATIONS:  Will continue an aspirin a day.  We will recheck another  CT of the head on May 02, 2005, although it is quite possible that the  stroke will still not show up as this is likely to be a small subcortical  event which may be hidden by the underlying white matter ischemic changes.  We will check a fasting lipid and homocysteine to complete a stroke workup,  although he had a preoperative carotid Doppler study which was unremarkable  and really does not need an echocardiogram.  The main thing he needs at this  point is physical and occupational therapy evaluations and follow progress,  possibly rehab evaluation.  Stroke service to follow.   Thank you for this consultation.      Michael L. Thad Ranger, M.D.  Electronically Signed     MLR/MEDQ  D:  04/30/2005  T:  05/02/2005  Job:  [id number]   cc:   Darlin Priestly, MD Fax:  [phone]

## 2010-11-20 LAB — CBC
HCT: 30 — ABNORMAL LOW
Hemoglobin: 14.6
MCHC: 34.4
MCHC: 35.5
MCHC: 35.9
MCV: 100
MCV: 96.9
Platelets: 146 — ABNORMAL LOW
Platelets: 151
Platelets: 162
Platelets: 232
RBC: 3.37 — ABNORMAL LOW
RDW: 13.3
RDW: 13.3
RDW: 13.3

## 2010-11-20 LAB — URINE CULTURE

## 2010-11-20 LAB — URINALYSIS, ROUTINE W REFLEX MICROSCOPIC
Nitrite: NEGATIVE
Specific Gravity, Urine: 1.023
Urobilinogen, UA: 0.2

## 2010-11-20 LAB — PROTIME-INR
INR: 0.9
INR: 1.9 — ABNORMAL HIGH
Prothrombin Time: 14.9
Prothrombin Time: 22.5 — ABNORMAL HIGH

## 2010-11-20 LAB — BASIC METABOLIC PANEL
BUN: 8
BUN: 9
BUN: 9
CO2: 25
CO2: 26
Calcium: 8.4
Chloride: 103
Creatinine, Ser: 0.8
Creatinine, Ser: 0.98
GFR calc Af Amer: >60
GFR calc non Af Amer: >60
Glucose, Bld: 105 — ABNORMAL HIGH
Glucose, Bld: 113 — ABNORMAL HIGH
Glucose, Bld: 123 — ABNORMAL HIGH
Potassium: 4

## 2010-11-20 LAB — COMPREHENSIVE METABOLIC PANEL
ALT: 23
Albumin: 4.3
BUN: 10
Calcium: 9.5
Glucose, Bld: 102 — ABNORMAL HIGH
Sodium: 137
Total Protein: 7.2

## 2010-11-20 LAB — TYPE AND SCREEN
ABO/RH(D): A POS
Antibody Screen: NEGATIVE

## 2010-11-20 LAB — APTT: aPTT: 30

## 2010-11-20 LAB — DIFFERENTIAL
Lymphs Abs: 1.3
Monocytes Absolute: 0.4
Monocytes Relative: 6
Neutro Abs: 4.3
Neutrophils Relative %: 70

## 2011-01-07 ENCOUNTER — Encounter (HOSPITAL_COMMUNITY): Payer: Self-pay | Admitting: Pharmacy Technician

## 2011-01-08 ENCOUNTER — Ambulatory Visit (HOSPITAL_COMMUNITY)
Admission: RE | Admit: 2011-01-08 | Discharge: 2011-01-08 | Disposition: A | Discharge status: Home / Self Care | Payer: Medicare HMO | Source: Ambulatory Visit | Attending: Cardiovascular Disease | Admitting: Cardiovascular Disease

## 2011-01-08 ENCOUNTER — Encounter (HOSPITAL_COMMUNITY)
Admission: RE | Disposition: A | Discharge status: Home / Self Care | Payer: Self-pay | Source: Ambulatory Visit | Attending: Cardiovascular Disease

## 2011-01-08 ENCOUNTER — Encounter (HOSPITAL_COMMUNITY): Payer: Self-pay

## 2011-01-08 DIAGNOSIS — Z45018 Encounter for adjustment and management of other part of cardiac pacemaker: Secondary | ICD-10-CM | POA: Insufficient documentation

## 2011-01-08 DIAGNOSIS — F329 Major depressive disorder, single episode, unspecified: Secondary | ICD-10-CM

## 2011-01-08 DIAGNOSIS — J310 Chronic rhinitis: Secondary | ICD-10-CM

## 2011-01-08 DIAGNOSIS — E785 Hyperlipidemia, unspecified: Secondary | ICD-10-CM | POA: Insufficient documentation

## 2011-01-08 DIAGNOSIS — M1710 Unilateral primary osteoarthritis, unspecified knee: Secondary | ICD-10-CM

## 2011-01-08 DIAGNOSIS — R42 Dizziness and giddiness: Secondary | ICD-10-CM

## 2011-01-08 DIAGNOSIS — E782 Mixed hyperlipidemia: Secondary | ICD-10-CM

## 2011-01-08 DIAGNOSIS — I10 Essential (primary) hypertension: Secondary | ICD-10-CM | POA: Insufficient documentation

## 2011-01-08 DIAGNOSIS — F0390 Unspecified dementia without behavioral disturbance: Secondary | ICD-10-CM | POA: Insufficient documentation

## 2011-01-08 DIAGNOSIS — M25539 Pain in unspecified wrist: Secondary | ICD-10-CM

## 2011-01-08 DIAGNOSIS — N209 Urinary calculus, unspecified: Secondary | ICD-10-CM

## 2011-01-08 DIAGNOSIS — Z01818 Encounter for other preprocedural examination: Secondary | ICD-10-CM | POA: Insufficient documentation

## 2011-01-08 DIAGNOSIS — I6789 Other cerebrovascular disease: Secondary | ICD-10-CM

## 2011-01-08 DIAGNOSIS — Z951 Presence of aortocoronary bypass graft: Secondary | ICD-10-CM | POA: Insufficient documentation

## 2011-01-08 DIAGNOSIS — I2510 Atherosclerotic heart disease of native coronary artery without angina pectoris: Secondary | ICD-10-CM

## 2011-01-08 DIAGNOSIS — G4730 Sleep apnea, unspecified: Secondary | ICD-10-CM

## 2011-01-08 HISTORY — PX: PERMANENT PACEMAKER GENERATOR CHANGE: SHX6022

## 2011-01-08 LAB — CBC
HCT: 42.2 % (ref 39.0–52.0)
Hemoglobin: 14.6 g/dL (ref 13.0–17.0)
MCH: 33.3 pg (ref 26.0–34.0)
RBC: 4.39 MIL/uL (ref 4.22–5.81)

## 2011-01-08 LAB — BASIC METABOLIC PANEL
CO2: 27 mEq/L (ref 19–32)
Calcium: 9.4 mg/dL (ref 8.4–10.5)
Glucose, Bld: 90 mg/dL (ref 70–99)
Potassium: 4.7 mEq/L (ref 3.5–5.1)
Sodium: 141 mEq/L (ref 135–145)

## 2011-01-08 LAB — PROTIME-INR: INR: 0.95 (ref 0.00–1.49)

## 2011-01-08 SURGERY — PERMANENT PACEMAKER GENERATOR CHANGE
Anesthesia: LOCAL

## 2011-01-08 MED ORDER — SODIUM CHLORIDE 0.45 % IV SOLN: INTRAVENOUS | Status: AC

## 2011-01-08 MED ORDER — HEPARIN (PORCINE) IN NACL 2-0.9 UNIT/ML-% IJ SOLN
INTRAMUSCULAR | Status: AC
  Filled 2011-01-08: qty 1000

## 2011-01-08 MED ORDER — SODIUM CHLORIDE 0.9 % IR SOLN
Status: AC
  Administered 2011-01-08: 16:00:00
  Filled 2011-01-08: qty 2

## 2011-01-08 MED ORDER — LIDOCAINE HCL (PF) 1 % IJ SOLN
INTRAMUSCULAR | Status: AC
  Filled 2011-01-08: qty 60

## 2011-01-08 MED ORDER — CEFAZOLIN SODIUM-DEXTROSE 2-3 GM-% IV SOLR
2.0000 g | INTRAVENOUS | Status: AC
  Administered 2011-01-08: 2 g via INTRAVENOUS
  Filled 2011-01-08: qty 50

## 2011-01-08 MED ORDER — SODIUM CHLORIDE 0.45 % IV SOLN
INTRAVENOUS | Status: DC
  Administered 2011-01-08: 14:00:00 via INTRAVENOUS

## 2011-01-08 MED ORDER — MUPIROCIN 2 % EX OINT
TOPICAL_OINTMENT | CUTANEOUS | Status: AC
  Administered 2011-01-08: 1 via NASAL
  Filled 2011-01-08: qty 22

## 2011-01-08 NOTE — H&P (Signed)
Chief Complaint:  Pacemaker generator at elective replacement interval  HPI:  This is a 77 y.o. male with a past medical history significant for previous coronary artery bypass surgery in 2007 (LIMA to LAD SVG to PDA sequential SVG to OM1 and OM 2 SVG to diagonal) who also has a dual chamber permanent pacemaker (Medtronic Kappa KDR 901 serial number [id number] implanted generator 2004) and has reached elective replacement interval. He is here for generator replacement .  Of note the patient is pacemaker dependent due to complete heart block.  His most recent device and traditional September 12 all lead parameters were within the desirable range. Counters showed 77% atrial pacing and 100% ventricular pacing.  PMHx:  As above also significant for dyslipidemia systemic hypertension and early dementia  No past surgical history on file.  FAMHx:  No family history on file.  SOCHx:   does not have a smoking history on file. He does not have any smokeless tobacco history on file. His alcohol and drug histories not on file.  ALLERGIES:  No Known Allergies  ROS: A comprehensive review of systems was negative.except for mild dizziness  HOME MEDS: No current facility-administered medications on file as of 01/08/2011.   Medications Prior to Admission  Medication Sig Dispense Refill  . Cetirizine HCl 10 MG CAPS Take by mouth every morning.        . citalopram (CELEXA) 20 MG tablet Take 20 mg by mouth every morning.        . dipyridamole-aspirin (AGGRENOX) 25-200 MG per 12 hr capsule Take 1 capsule by mouth 2 (two) times daily.       Marland Kitchen donepezil (ARICEPT) 10 MG tablet Take 10 mg by mouth every morning.        Marland Kitchen l-methylfolate-B6-B12 (METANX) 3-35-2 MG TABS Take 1 tablet by mouth every morning.        . meclizine (ANTIVERT) 25 MG tablet Take 12.5-25 mg by mouth every 6 (six) hours as needed. For dizziness       . memantine (NAMENDA) 10 MG tablet Take 10 mg by mouth 2 (two) times daily.          . metoprolol tartrate (LOPRESSOR) 25 MG tablet Take 12.5 mg by mouth 2 (two) times daily.        . Misc Natural Products (OSTEO BI-FLEX ADV DOUBLE ST) TABS Take 1 tablet by mouth daily.        . simvastatin (ZOCOR) 40 MG tablet Take 40 mg by mouth at bedtime.          LABS/IMAGING: No results found for this or any previous visit (from the past 48 hour(s)). No results found.  VITALS: Blood pressure 118/69, pulse 65, temperature 96.5 F (35.8 C), temperature source Oral, resp. rate 18, height 6\' 1"  (1.854 m), weight 104.327 kg (230 lb), SpO2 99.00%.  EXAM: General appearance: alert and no distress Neck: no adenopathy, no carotid bruit, no JVD, supple, symmetrical, trachea midline and thyroid not enlarged, symmetric, no tenderness/mass/nodules Lungs: clear to auscultation bilaterally Heart: regular rate and rhythm, S1, S2 normal, no murmur, click, rub or gallop Abdomen: soft, non-tender; bowel sounds normal; no masses,  no organomegaly Extremities: extremities normal, atraumatic, no cyanosis or edema Pulses: 2+ and symmetric Skin: Skin color, texture, turgor normal. No rashes or lesions or Pacer site healthy Neurologic: Grossly normal  IMPRESSION: Pacemaker at elective replacement interval  PLAN: Risks and benefits of generator changeout were discussed in detail. The patient was sent and agrees to proceed.  Nonie Lochner 01/08/2011, 11:52 AM

## 2011-01-08 NOTE — Op Note (Signed)
Procedure report  Procedure performed:   Dual-chamber permanent pacemaker generator changeout   Reason for procedure:  1. Device generator at elective replacement interval  Procedure performed by:  Thurmon Fair, MD  Complications:  None  Estimated blood loss:  <5 mL  Medications administered during procedure:  Ancef 1 g intravenously,lidocaine 1% 30 mL locally. No sedation was administered  Device details:   Explanted Generator Metronic model number Kappa DR KDR 901, serial number [id number](implanted March 12 2002) Right atrial lead (chronic) Medtronic, model number [id number], serial [id number](implanted 03/12/2002) Right ventricular lead (chronic)  Medtronic, model number [id number], serial number [id number] (implanted 03/12/2002   New generator Medtronic Adapta,  model number ADD R. L1, serial number  [id number]   Procedure details:  After the risks and benefits of the procedure were discussed the patient provided informed consent. She was brought to the cardiac catheter lab in the fasting state. The patient was prepped and draped in usual sterile fashion. Local anesthesia with 1% lidocaine was administered to to the left infraclavicular area. A 5-6cm horizontal incision was made parallel with and 2-3 cm caudal to the right clavicle, in the area of an old scar. An older scar was seen closer to the left clavicle. Using minimal electrocautery and mostly sharp and blunt dissection the prepectoral pocket was opened carefully to avoid injury to thet loops of chronic leads. Extensive dissection was necessary. The device was explanted. The pocket was carefully inspected for hemostasis and flushed with copious amounts of antibiotic solution.  The leads were disconnected from the old generator and testing of the lead parameters later showed excellent values. The new generator was connected to the chronic leads, with appropriate pacing noted.   The entire system was then carefully  inserted in the pocket with care been taking that the leads and device assumed a comfortable position without pressure on the incision. Great care was taken that the leads be located deep to the generator. The pocket was then closed in layers using 2 layers of 2-0 Vicryl and cutaneous staples after which a sterile dressing was applied.   At the end of the procedure the following lead parameters were encountered:   Right atrial lead sensed P waves 3.5 mV, impedance 408 ohms, threshold 0.3 at 0.5 ms pulse width.  Right ventricular lead sensed R waves  Undetectable pacemaker dependent, impedance 613 ohms, threshold 1.0 at 0.4 ms pulse width.

## 2011-01-11 ENCOUNTER — Encounter (HOSPITAL_COMMUNITY): Payer: Self-pay

## 2011-01-29 ENCOUNTER — Other Ambulatory Visit: Payer: Self-pay | Admitting: Neurology

## 2011-01-29 DIAGNOSIS — F0390 Unspecified dementia without behavioral disturbance: Secondary | ICD-10-CM

## 2011-02-02 ENCOUNTER — Ambulatory Visit
Admission: RE | Admit: 2011-02-02 | Discharge: 2011-02-02 | Disposition: A | Discharge status: Home / Self Care | Payer: Medicare HMO | Source: Ambulatory Visit | Attending: Neurology | Admitting: Neurology

## 2011-02-02 DIAGNOSIS — F0390 Unspecified dementia without behavioral disturbance: Secondary | ICD-10-CM

## 2011-02-02 MED ORDER — IOHEXOL 300 MG/ML  SOLN
75.0000 mL | Freq: Once | INTRAMUSCULAR | Status: AC | PRN
  Administered 2011-02-02: 75 mL via INTRAVENOUS

## 2011-06-08 ENCOUNTER — Ambulatory Visit: Payer: Medicare HMO | Admitting: Family Medicine

## 2011-06-24 ENCOUNTER — Ambulatory Visit: Payer: Medicare HMO | Admitting: Family Medicine

## 2011-06-29 ENCOUNTER — Ambulatory Visit: Payer: Medicare HMO | Admitting: Family Medicine

## 2011-06-29 ENCOUNTER — Ambulatory Visit (INDEPENDENT_AMBULATORY_CARE_PROVIDER_SITE_OTHER): Payer: MEDICARE | Admitting: Family Medicine

## 2011-06-29 ENCOUNTER — Encounter: Payer: Self-pay | Admitting: Family Medicine

## 2011-06-29 VITALS — BP 112/56 | HR 88 | Temp 97.9°F | Ht 72.0 in | Wt 233.0 lb

## 2011-06-29 DIAGNOSIS — Z23 Encounter for immunization: Secondary | ICD-10-CM

## 2011-06-29 DIAGNOSIS — E782 Mixed hyperlipidemia: Secondary | ICD-10-CM

## 2011-06-29 DIAGNOSIS — F0390 Unspecified dementia without behavioral disturbance: Secondary | ICD-10-CM

## 2011-06-29 DIAGNOSIS — I2510 Atherosclerotic heart disease of native coronary artery without angina pectoris: Secondary | ICD-10-CM

## 2011-06-29 DIAGNOSIS — I6789 Other cerebrovascular disease: Secondary | ICD-10-CM

## 2011-06-29 DIAGNOSIS — I10 Essential (primary) hypertension: Secondary | ICD-10-CM

## 2011-06-29 NOTE — Patient Instructions (Signed)
Don't change your meds for now and I'll await the labs from cardiology.  Take care.

## 2011-07-01 ENCOUNTER — Encounter: Payer: Self-pay | Admitting: Family Medicine

## 2011-07-01 DIAGNOSIS — F0390 Unspecified dementia without behavioral disturbance: Secondary | ICD-10-CM | POA: Insufficient documentation

## 2011-07-01 NOTE — Assessment & Plan Note (Signed)
Controlled, continue meds

## 2011-07-01 NOTE — Assessment & Plan Note (Signed)
Continue current meds.  Awaiting labs from cards.

## 2011-07-01 NOTE — Assessment & Plan Note (Signed)
Continue current meds, awaiting records.

## 2011-07-01 NOTE — Progress Notes (Signed)
New pt to est care.    Elevated Cholesterol: Using medications without problems:yes Muscle aches: no Diet compliance:yes Exercise: limited Other complaints: labs pending per [facility]  Hypertension:    Using medication without problems or lightheadedness: yes Chest pain with exertion:no Edema:no Short of breath:no  H/o CVA, no recent focal neuro changes.  H/o dementia after CVA and h/o L foot drop. Lives at home with wife, not oriented to year.  Doesn't drive, cook, but is able to transfer, ambulate.    Meds, vitals, and allergies reviewed.   PMH and SH reviewed  ROS: See HPI.  Otherwise negative.    GEN: nad, alert but not oriented to year, pleasant in conversation. 0/3 on recall.  HEENT: mucous membranes moist NECK: supple w/o LA CV: rrr. PULM: ctab, no inc wob ABD: soft, +bs EXT: no edema SKIN: no acute rash

## 2011-07-01 NOTE — Assessment & Plan Note (Signed)
Continue current meds for secondary prevention.  Awaiting notes from neuro.

## 2011-07-01 NOTE — Assessment & Plan Note (Signed)
Continue current meds for secondary prevention.  Awaiting labs from cards.

## 2011-07-05 ENCOUNTER — Encounter: Payer: Self-pay | Admitting: Family Medicine

## 2011-07-12 ENCOUNTER — Ambulatory Visit (INDEPENDENT_AMBULATORY_CARE_PROVIDER_SITE_OTHER): Payer: MEDICARE | Admitting: Family Medicine

## 2011-07-12 ENCOUNTER — Telehealth: Payer: Self-pay | Admitting: Family Medicine

## 2011-07-12 ENCOUNTER — Encounter: Payer: Self-pay | Admitting: Family Medicine

## 2011-07-12 VITALS — BP 132/82 | HR 83 | Temp 98.1°F | Ht 72.0 in | Wt 231.8 lb

## 2011-07-12 DIAGNOSIS — J069 Acute upper respiratory infection, unspecified: Secondary | ICD-10-CM

## 2011-07-12 MED ORDER — BENZONATATE 200 MG PO CAPS: 200.0000 mg | ORAL_CAPSULE | Freq: Three times a day (TID) | ORAL | Status: AC | PRN

## 2011-07-12 MED ORDER — AMOXICILLIN-POT CLAVULANATE 875-125 MG PO TABS: 1.0000 | ORAL_TABLET | Freq: Two times a day (BID) | ORAL | Status: AC

## 2011-07-12 NOTE — Progress Notes (Signed)
Subjective:    Patient ID: Kurt [name], male    DOB: 03/15/1931, 78 y.o.   MRN: [id number]  HPI Has a very bad cough - worse than usual  [place]  Last 2 days- is constant   Does take zyrtec Also runny and stuffy nose -- clear mucous? - does not look at it generally  occ prod cough- ? If color to that - does not look  Had a [name] grade fever last night  No chills , no body aches   Very tired   Took robitussin this am - did not help at all   No ha or sinus pressure   Patient Active Problem List  Diagnoses  . HYPERLIPIDEMIA  . Depressive Disorder, not Elsewhere Classified  . HYPERTENSION, BENIGN ESSENTIAL  . C A D  . C V A/STROKE  . CHRONIC RHINITIS  . GERD  . KIDNEY STONE  . DEGENERATIVE JOINT DISEASE, KNEES, BILATERAL  . WRIST PAIN, RIGHT  . ORTHOSTATIC DIZZINESS  . SLEEP APNEA  . HEMOPTYSIS  . Dementia  . Viral URI with cough   Past Medical History  Diagnosis Date  . Hypertension   . Hyperlipidemia   . Chronic kidney disease     stones  . Stroke   . CAD (coronary artery disease)   . Dementia     after CVA  . Foot drop, left   . Dementia    Past Surgical History  Procedure Date  . Appendectomy   . Tonsillectomy and adenoidectomy 1960's  . 3 back surgeries 1980's  . Pacemaker insertion 2000  . 5 bypass 04/29/2005  . Stroke during bypass surgery 04/29/2005 and another 05/17/05 2007  . Total knee replacement, left 10/07  . Total knee replacement, right 2/09  . 2nd pacemaker, generator (battery) replaced 01/08/11   History  Substance Use Topics  . Smoking status: Never Smoker   . Smokeless tobacco: Not on file  . Alcohol Use: No   Family History  Problem Relation Age of Onset  . Cancer Brother     stomach  . Heart disease Other   . Heart disease Mother   . Heart disease Father   . Colon cancer Neg Hx   . Prostate cancer Neg Hx    No Known Allergies Current Outpatient Prescriptions on File Prior to Visit    Medication Sig Dispense Refill  . calcium carbonate (OS-CAL) 600 MG TABS Take 600 mg by mouth 2 (two) times daily with a meal.      . Cetirizine HCl 10 MG CAPS Take by mouth every morning.        . citalopram (CELEXA) 20 MG tablet Take 20 mg by mouth every morning.        . dipyridamole-aspirin (AGGRENOX) 25-200 MG per 12 hr capsule Take 1 capsule by mouth 2 (two) times daily.       Marland Kitchen donepezil (ARICEPT) 23 MG TABS tablet Take one half (1/2) tablet by mouth daily.      Marland Kitchen l-methylfolate-B6-B12 (METANX) 3-35-2 MG TABS Take 1 tablet by mouth every morning.        . memantine (NAMENDA) 10 MG tablet Take 10 mg by mouth 2 (two) times daily.        . metoprolol tartrate (LOPRESSOR) 25 MG tablet Take 12.5 mg by mouth 2 (two) times daily.        . Misc Natural Products (OSTEO BI-FLEX ADV DOUBLE ST) TABS Take 1 tablet by mouth  daily.        . simvastatin (ZOCOR) 40 MG tablet Take 40 mg by mouth at bedtime.             Review of Systems Review of Systems  Constitutional: Negative for appetite change, fatigue and unexpected weight change.  ENT pos for rhinorrhea and congestion Eyes: Negative for pain and visual disturbance.  Respiratory: Negative for wheeze or sob  Cardiovascular: Negative for cp or palpitations    Gastrointestinal: Negative for nausea, diarrhea and constipation.  Genitourinary: Negative for urgency and frequency.  Skin: Negative for pallor or rash   Neurological: Negative for weakness, light-headedness, numbness and headaches.  Hematological: Negative for adenopathy. Does not bruise/bleed easily.  Psychiatric/Behavioral: Negative for dysphoric mood. The patient is not nervous/anxious.  pos for dementia with memory loss        Objective:   Physical Exam  Constitutional: He appears well-developed and well-nourished. No distress.  HENT:  Head: Normocephalic and atraumatic.  Right Ear: External ear normal.  Left Ear: External ear normal.  Mouth/Throat: Oropharynx is clear  and moist. No oropharyngeal exudate.       Nares are injected and congested   No sinus tenderness  Eyes: Conjunctivae and EOM are normal. Pupils are equal, round, and reactive to light. Right eye exhibits no discharge. Left eye exhibits no discharge.  Neck: Normal range of motion. Neck supple. No JVD present. No thyromegaly present.  Cardiovascular: Normal rate and regular rhythm.  Exam reveals no gallop.   Pulmonary/Chest: Effort normal and breath sounds normal. No respiratory distress. He has no wheezes. He has no rales.       Diffusely harsh bs Barky cough Few isolated rhonchi No wheeze or prolonged expiration  Lymphadenopathy:    He has no cervical adenopathy.  Neurological: He is alert.       Dementia noted , pleasant   Skin: Skin is warm and dry. No rash noted. No erythema. No pallor.  Psychiatric: He has a normal mood and affect.          Assessment & Plan:

## 2011-07-12 NOTE — Patient Instructions (Signed)
Drink fluids Get rest  Try tessalon for cough- swallow pills whole  Take the augmentin as directed  Update if not starting to improve in a week or if worsening

## 2011-07-12 NOTE — Assessment & Plan Note (Signed)
Chronically ill/ dementia pt with cough of ? Duration- now prod with fever Exam reassuring- but given length of illness and comorbid conditions- cover with augmentin and obs Tessalon for cough  Enc fluids -wife says this is difficult  Disc symptomatic care - see instructions on AVS  Update if not starting to improve in a week or if worsening

## 2011-07-12 NOTE — Telephone Encounter (Signed)
Caller: Kalman Jewels; PCP: Crawford Givens Clelia Croft); CB#: [phone];  Call regarding Cough/Congestion; Onset 07/11/11.  Kept awake during the night.  Small amount sputum -unknown color.  Temp. 98.6 oral.  Appt. with Dr. Milinda Antis per caller request to have him seen. Home care for the interim and parameters for callback given per Cough protocol.

## 2011-07-14 NOTE — Telephone Encounter (Signed)
See [facility]'s OV note.

## 2011-07-27 ENCOUNTER — Encounter: Payer: Self-pay | Admitting: Family Medicine

## 2011-08-17 ENCOUNTER — Telehealth: Payer: Self-pay | Admitting: Family Medicine

## 2011-08-17 NOTE — Telephone Encounter (Signed)
Caller: Kalman Jewels; PCP: Crawford Givens Clelia Croft); CB#: [phone];  Call regarding Confusion;  Asking for appt. Onset 08/16/11.  Afebrile.  Reports visual hallucinations (seeing daughter and grandaughter), and seeing things that are not there (ie water on the floor).  Confused; unable to find refrigerator; watching TV without knowing what he is seeing. Had  headache 08/16/11; stumbling "like loss of balance."  Advised to call 911 now for new or worsening neuro deficits and new or worsening confusion and disorientation per Confusion, Disorientation, Agitation Guideline.

## 2011-08-17 NOTE — Telephone Encounter (Signed)
Agreed, 911 reasonable.  They should be in route by now.  Please get update on patient tomorrow.  Thanks.

## 2011-08-17 NOTE — Telephone Encounter (Signed)
I phoned the home number and the wife answered and says that she called his Neurologist and they are trying to get him in this afternoon but if not, will see him tomorrow.  Wife says he has calmed down now (and she has too) and he is sleeping.  She says she realizes that with Alzheimer's, they have hallucinations because other folks that she has taken care of also had those episodes.  She thanked [place] for calling to check on him.

## 2011-08-17 NOTE — Telephone Encounter (Signed)
Noted  

## 2011-10-11 ENCOUNTER — Encounter: Payer: Self-pay | Admitting: Family Medicine

## 2011-10-11 ENCOUNTER — Ambulatory Visit (INDEPENDENT_AMBULATORY_CARE_PROVIDER_SITE_OTHER): Payer: MEDICARE | Admitting: Family Medicine

## 2011-10-11 VITALS — BP 110/70 | HR 91 | Temp 97.8°F | Wt 234.0 lb

## 2011-10-11 DIAGNOSIS — R05 Cough: Secondary | ICD-10-CM

## 2011-10-11 MED ORDER — FLUTICASONE PROPIONATE 50 MCG/ACT NA SUSP: NASAL | Status: DC

## 2011-10-11 NOTE — Progress Notes (Signed)
Here today with wife.  2 weeks (at least) of cough.  Worse at night if on his back, wife thought this was due to post nasal gtt.  Improves if laying on his side at night.  No fevers.  No chills.  No sputum.  The cough "sounds wet" per wife.  Not sob at rest, mildly sob walking and this is at baseline.  No recent med changes.  He doesn't feel unwell but the cough isn't improved.  Taking dayquil w/o relief for the cough.  Fatigue is at baseline.  Cough is equal at night and during the day.    Meds, vitals, and allergies reviewed.   ROS: See HPI.  Otherwise, noncontributory.  nad ncat Tm wnl x2 Nasal exam stuffy OP with mild cobblestoning Neck supple, no LA rrr Ctab, no wheeze and no focal dec in BS Ext w/o cyanosis

## 2011-10-11 NOTE — Patient Instructions (Addendum)
Your lungs are clear.  I think the cough is from post nasal drip.  Use flonase, 2 sprays in each nostril once a day.  If you have more shortness of breath, a fever, or discolored sputum then notify the clinic.  Take care.

## 2011-10-12 DIAGNOSIS — R05 Cough: Secondary | ICD-10-CM | POA: Insufficient documentation

## 2011-10-12 NOTE — Assessment & Plan Note (Signed)
Ctab, likely due to postnasal gtt.  Start flonase and f/u prn.  Nontoxic. I doubt pulmonary pathology given the exam and duration.  Wife and patient agree with plan.

## 2012-01-25 ENCOUNTER — Ambulatory Visit (INDEPENDENT_AMBULATORY_CARE_PROVIDER_SITE_OTHER): Payer: MEDICARE | Admitting: Family Medicine

## 2012-01-25 ENCOUNTER — Encounter: Payer: Self-pay | Admitting: Family Medicine

## 2012-01-25 VITALS — BP 104/70 | HR 89 | Temp 97.7°F | Wt 234.0 lb

## 2012-01-25 DIAGNOSIS — R05 Cough: Secondary | ICD-10-CM

## 2012-01-25 DIAGNOSIS — R319 Hematuria, unspecified: Secondary | ICD-10-CM

## 2012-01-25 DIAGNOSIS — N471 Phimosis: Secondary | ICD-10-CM

## 2012-01-25 LAB — POCT URINALYSIS DIPSTICK
Glucose, UA: NEGATIVE
Nitrite, UA: NEGATIVE
Urobilinogen, UA: NEGATIVE

## 2012-01-25 MED ORDER — CLOTRIMAZOLE 1 % EX CREA: TOPICAL_CREAM | Freq: Two times a day (BID) | CUTANEOUS | Status: DC

## 2012-01-25 NOTE — Patient Instructions (Addendum)
Move the foreskin back twice a day and put the cream on the irritated area.  Use until resolved and then for a few more days.  If the foreskin is "stuck" or won't move, or very painful then go the ER.

## 2012-01-25 NOTE — Progress Notes (Signed)
H/o dementia.  Here today with wife.  Wife had noted spot of blood in front of underwear yesterday.  No burning with urination.  H/o renal stones.  No abd pain that was similar to the prev stones. Never smoked. No blood in stool known.   Temp this AM was ~99.5.  Some cough, worse at night, but this is at baseline and per wife "I think he's in the habit of it."  He doesn't remember any of the events.  His only complaint was about the cough, at the time of the cough.    Wife has noted no acute changes in speech but memory seems to be worse in the last month.  Is on aggrenox.   Meds, vitals, and allergies reviewed.   ROS: See HPI.  Otherwise, noncontributory.  Not oriented to date, baseline.   rrr Ctab, no wheeze, no focal dec in BS Phimosis noted, likely fungal.  Able to retract foreskin w/o discomfort.

## 2012-01-26 DIAGNOSIS — N471 Phimosis: Secondary | ICD-10-CM | POA: Insufficient documentation

## 2012-01-26 NOTE — Assessment & Plan Note (Signed)
Likely habitual, ctab and I wouldn't intervene on this.  Temp is normalized and no temps >100.4.  Okay for outpatient f/u.

## 2012-01-26 NOTE — Assessment & Plan Note (Signed)
Likely fungal.  Retract foreskin, use topical antifungal, if painful or unable to retract then go to ER.  D/w wife.  She understood.  Should improve.  Okay for outpatient fu.

## 2012-04-04 ENCOUNTER — Encounter (HOSPITAL_COMMUNITY): Payer: Self-pay | Admitting: Emergency Medicine

## 2012-04-04 ENCOUNTER — Other Ambulatory Visit: Payer: Self-pay

## 2012-04-04 ENCOUNTER — Emergency Department (HOSPITAL_COMMUNITY): Payer: Medicare HMO

## 2012-04-04 ENCOUNTER — Telehealth: Payer: Self-pay

## 2012-04-04 ENCOUNTER — Emergency Department (HOSPITAL_COMMUNITY)
Admission: EM | Admit: 2012-04-04 | Discharge: 2012-04-04 | Disposition: A | Discharge status: Home / Self Care | Payer: Medicare HMO | Attending: Emergency Medicine | Admitting: Emergency Medicine

## 2012-04-04 DIAGNOSIS — Z951 Presence of aortocoronary bypass graft: Secondary | ICD-10-CM | POA: Insufficient documentation

## 2012-04-04 DIAGNOSIS — I2510 Atherosclerotic heart disease of native coronary artery without angina pectoris: Secondary | ICD-10-CM | POA: Insufficient documentation

## 2012-04-04 DIAGNOSIS — I69998 Other sequelae following unspecified cerebrovascular disease: Secondary | ICD-10-CM | POA: Insufficient documentation

## 2012-04-04 DIAGNOSIS — F0390 Unspecified dementia without behavioral disturbance: Secondary | ICD-10-CM | POA: Insufficient documentation

## 2012-04-04 DIAGNOSIS — R059 Cough, unspecified: Secondary | ICD-10-CM | POA: Insufficient documentation

## 2012-04-04 DIAGNOSIS — R05 Cough: Secondary | ICD-10-CM

## 2012-04-04 DIAGNOSIS — R079 Chest pain, unspecified: Secondary | ICD-10-CM | POA: Insufficient documentation

## 2012-04-04 DIAGNOSIS — Z87442 Personal history of urinary calculi: Secondary | ICD-10-CM | POA: Insufficient documentation

## 2012-04-04 DIAGNOSIS — E785 Hyperlipidemia, unspecified: Secondary | ICD-10-CM | POA: Insufficient documentation

## 2012-04-04 DIAGNOSIS — I10 Essential (primary) hypertension: Secondary | ICD-10-CM | POA: Insufficient documentation

## 2012-04-04 DIAGNOSIS — Z950 Presence of cardiac pacemaker: Secondary | ICD-10-CM | POA: Insufficient documentation

## 2012-04-04 DIAGNOSIS — Z8659 Personal history of other mental and behavioral disorders: Secondary | ICD-10-CM | POA: Insufficient documentation

## 2012-04-04 LAB — BASIC METABOLIC PANEL
BUN: 12 mg/dL (ref 6–23)
Calcium: 10.1 mg/dL (ref 8.4–10.5)
Creatinine, Ser: 0.95 mg/dL (ref 0.50–1.35)
GFR calc Af Amer: >90 mL/min (ref >90)
GFR calc non Af Amer: 78 mL/min — ABNORMAL LOW (ref >90)

## 2012-04-04 LAB — CBC WITH DIFFERENTIAL/PLATELET
Basophils Relative: 0 % (ref 0–1)
Eosinophils Absolute: 0 10*3/uL (ref 0.0–0.7)
HCT: 46.5 % (ref 39.0–52.0)
Hemoglobin: 16.3 g/dL (ref 13.0–17.0)
MCH: 33.2 pg (ref 26.0–34.0)
MCHC: 35.1 g/dL (ref 30.0–36.0)
Monocytes Absolute: 0.5 10*3/uL (ref 0.1–1.0)
Monocytes Relative: 6 % (ref 3–12)
Neutrophils Relative %: 78 % — ABNORMAL HIGH (ref 43–77)

## 2012-04-04 NOTE — ED Notes (Signed)
Pt c/o N/V while driving in car that may have had blood then pt c/o CP and became diaphoretic; pt denies pain at present

## 2012-04-04 NOTE — Telephone Encounter (Signed)
I will await ER notes.

## 2012-04-04 NOTE — Telephone Encounter (Signed)
Kurt [name] at front desk took call from lady that pt's wife was bringing pt to office now because he was vomiting blood. Less than 5 minutes later [place] took call from pts son that pt was also having chest pain and was taking pt somewhere else to be evaluated. I called pt's wife and she is on route now to [facility] ER; pt's wife ended call.

## 2012-04-04 NOTE — ED Provider Notes (Signed)
History     CSN: [id number]  Arrival date & time 04/04/12  1546   First MD Initiated Contact with Patient 04/04/12 1618      Chief Complaint  Patient presents with  . Chest Pain    (Consider location/radiation/quality/duration/timing/severity/associated sxs/prior treatment) Patient is a 79 y.o. male presenting with chest pain. The history is provided by the patient and the spouse. The history is limited by the condition of the patient.  Chest Pain The chest pain began 1 - 2 hours ago. Chest pain occurs intermittently. The chest pain is resolved. The pain is associated with coughing. The pain is currently at 0/10. The severity of the pain is mild. The quality of the pain is described as aching. The pain does not radiate. Primary symptoms include cough. Pertinent negatives for primary symptoms include no fever, no fatigue, no shortness of breath, no wheezing, no palpitations, no abdominal pain, no nausea and no vomiting.  Pertinent negatives for associated symptoms include no lower extremity edema and no weakness. He tried nothing for the symptoms. Risk factors include male gender and being elderly.  His past medical history is significant for arrhythmia, CAD, hyperlipidemia and hypertension.  Procedure history is positive for cardiac catheterization. Procedure history comments: CABG.     Past Medical History  Diagnosis Date  . Hypertension   . Hyperlipidemia   . Renal stones     stones  . Stroke   . CAD (coronary artery disease)   . Dementia     after CVA  . Foot drop, left   . Dementia     Past Surgical History  Procedure Date  . Appendectomy   . Tonsillectomy and adenoidectomy 1960's  . 3 back surgeries 1980's  . Pacemaker insertion 2000  . 5 bypass 04/29/2005  . Stroke during bypass surgery 04/29/2005 and another 05/17/05 2007  . Total knee replacement, left 10/07  . Total knee replacement, right 2/09  . 2nd pacemaker, generator (battery) replaced 01/08/11    Family  History  Problem Relation Age of Onset  . Cancer Brother     stomach  . Heart disease Other   . Heart disease Mother   . Heart disease Father   . Colon cancer Neg Hx   . Prostate cancer Neg Hx     History  Substance Use Topics  . Smoking status: Never Smoker   . Smokeless tobacco: Not on file  . Alcohol Use: No      Review of Systems  Constitutional: Negative for fever and fatigue.  HENT: Negative for congestion, rhinorrhea and postnasal drip.   Eyes: Negative for photophobia and visual disturbance.  Respiratory: Positive for cough. Negative for chest tightness, shortness of breath and wheezing.   Cardiovascular: Positive for chest pain. Negative for palpitations and leg swelling.  Gastrointestinal: Negative for nausea, vomiting, abdominal pain and diarrhea.  Genitourinary: Negative for urgency, frequency and difficulty urinating.  Musculoskeletal: Negative for back pain and arthralgias.  Skin: Negative for rash and wound.  Neurological: Negative for weakness and headaches.  Psychiatric/Behavioral: Negative for confusion and agitation.    Allergies  Review of patient's allergies indicates no known allergies.  Home Medications   Current Outpatient Rx  Name  Route  Sig  Dispense  Refill  . CALCIUM CARBONATE 600 MG PO TABS   Oral   Take 600 mg by mouth 2 (two) times daily with a meal.         . CETIRIZINE HCL 10 MG PO CAPS  Oral   Take 1 capsule by mouth daily as needed. For allergies         . CITALOPRAM HYDROBROMIDE 20 MG PO TABS   Oral   Take 20 mg by mouth every morning.           . ASPIRIN-DIPYRIDAMOLE ER 25-200 MG PO CP12   Oral   Take 1 capsule by mouth 2 (two) times daily.          . DONEPEZIL HCL 23 MG PO TABS   Oral   Take 23 mg by mouth daily. Take one half (1/2) tablet by mouth daily.         . L-METHYLFOLATE-B6-B12 3-35-2 MG PO TABS   Oral   Take 1 tablet by mouth every morning.           Marland Kitchen MEMANTINE HCL 10 MG PO TABS   Oral    Take 10 mg by mouth 2 (two) times daily.           Marland Kitchen METOPROLOL TARTRATE 25 MG PO TABS   Oral   Take 12.5 mg by mouth 2 (two) times daily.           Marland Kitchen SIMVASTATIN 40 MG PO TABS   Oral   Take 40 mg by mouth at bedtime.             BP 127/64  Pulse 59  Temp 98.1 F (36.7 C) (Oral)  Resp 16  SpO2 94%  Physical Exam  Nursing note and vitals reviewed. Constitutional: He is oriented to person, place, and time. He appears well-developed and well-nourished. No distress.  HENT:  Head: Normocephalic and atraumatic.  Mouth/Throat: Oropharynx is clear and moist.  Eyes: EOM are normal. Pupils are equal, round, and reactive to light.  Neck: Normal range of motion. Neck supple.  Cardiovascular: Normal rate, regular rhythm, normal heart sounds and intact distal pulses.   Pulmonary/Chest: Effort normal and breath sounds normal. He has no wheezes. He has no rales.  Abdominal: Soft. Bowel sounds are normal. He exhibits no distension. There is no tenderness. There is no rebound and no guarding.  Musculoskeletal: Normal range of motion. He exhibits no edema and no tenderness.  Lymphadenopathy:    He has no cervical adenopathy.  Neurological: He is alert and oriented to person, place, and time. He displays normal reflexes. No cranial nerve deficit. He exhibits normal muscle tone. Coordination normal.  Skin: Skin is warm and dry. No rash noted.  Psychiatric: He has a normal mood and affect. His behavior is normal.    ED Course  Procedures (including critical care time)   Date: 04/04/2012  Rate: 84  Rhythm: ventricular paced rhythm  QRS Axis: indeterminate  Intervals: QRS 178 ms  ST/T Wave abnormalities: nonspecific ST changes and nonspecific T wave changes  Conduction Disutrbances:none  Narrative Interpretation:   Old EKG Reviewed: unchanged    Labs Reviewed  CBC WITH DIFFERENTIAL - Abnormal; Notable for the following:    Neutrophils Relative 78 (*)     All other components  within normal limits  BASIC METABOLIC PANEL - Abnormal; Notable for the following:    Glucose, Bld 104 (*)     GFR calc non Af Amer 78 (*)     All other components within normal limits  POCT I-STAT TROPONIN I  TROPONIN I   Dg Chest 2 View  04/04/2012  *RADIOLOGY REPORT*  Clinical Data: Chest pain.  Hypertension and coronary artery disease.  CHEST - 2 VIEW  Comparison: 04/09/2009  Findings: Normal lung volumes.  Right chest wall porta-catheter is noted with tip in the cavoatrial junction and right atrium. Previous median sternotomy and CABG procedure.  Heart size is normal.  There is no pleural effusion or edema.  No airspace consolidation identified. The visualized bony structures are unremarkable.  IMPRESSION:  1. No active cardiopulmonary abnormalities.   Original Report Authenticated By: Signa Kell, M.D.      1. Cough   2. Chest pain       MDM  40M with pmhx of CAD s/p CABG 2007, heart block s/p pacemaker, HTN, HLD, CVA, dementia who presents today for a couple months of productive cough and then developed an episode of chest pain today. Pt is pleasant and able answer questions appropriately but wife gave most of history. She said the pain was sudden but then left without intervention. No dyspnea, diaphoresis, or vomiting. On arrival, pt reports he does not have any chest pain but that the cough has been bothering him. Vitals appear stable. CXR negative for acute cardiopulmonary disease. EKG without ischemic changes. Initial troponin negative. Spoke with [employer] on call physician who states a delta troponin and outpatient follow up this week would be appropriate. I currently doubt ACS at this time. Could be more stable angina or related to cough. Second troponin pending.  Second troponin negative. Pt reports he is still chest pain free. He will f/u with cardiology. Return precautions given. Stable for d/c home.      Johnnette Gourd, MD 04/05/12 [id number]

## 2012-04-05 NOTE — ED Provider Notes (Signed)
I saw and evaluated the patient, reviewed the resident's note and I agree with the findings and plan. Pt w ?cp earlier today. Pt v difficult historian. Denies pain currently. Labs. Ecg.   Suzi Roots, MD 04/05/12 1249

## 2012-05-09 ENCOUNTER — Emergency Department (HOSPITAL_COMMUNITY)
Admission: EM | Admit: 2012-05-09 | Discharge: 2012-05-09 | Disposition: A | Discharge status: Home / Self Care | Payer: Medicare HMO | Attending: Emergency Medicine | Admitting: Emergency Medicine

## 2012-05-09 ENCOUNTER — Encounter (HOSPITAL_COMMUNITY): Payer: Self-pay | Admitting: Nurse Practitioner

## 2012-05-09 ENCOUNTER — Emergency Department (HOSPITAL_COMMUNITY): Payer: Medicare HMO

## 2012-05-09 ENCOUNTER — Telehealth: Payer: Self-pay | Admitting: Family Medicine

## 2012-05-09 DIAGNOSIS — Z950 Presence of cardiac pacemaker: Secondary | ICD-10-CM | POA: Insufficient documentation

## 2012-05-09 DIAGNOSIS — K4090 Unilateral inguinal hernia, without obstruction or gangrene, not specified as recurrent: Secondary | ICD-10-CM | POA: Insufficient documentation

## 2012-05-09 DIAGNOSIS — R05 Cough: Secondary | ICD-10-CM | POA: Insufficient documentation

## 2012-05-09 DIAGNOSIS — Z8739 Personal history of other diseases of the musculoskeletal system and connective tissue: Secondary | ICD-10-CM | POA: Insufficient documentation

## 2012-05-09 DIAGNOSIS — E785 Hyperlipidemia, unspecified: Secondary | ICD-10-CM | POA: Insufficient documentation

## 2012-05-09 DIAGNOSIS — R059 Cough, unspecified: Secondary | ICD-10-CM | POA: Insufficient documentation

## 2012-05-09 DIAGNOSIS — Z87442 Personal history of urinary calculi: Secondary | ICD-10-CM | POA: Insufficient documentation

## 2012-05-09 DIAGNOSIS — R1110 Vomiting, unspecified: Secondary | ICD-10-CM | POA: Insufficient documentation

## 2012-05-09 DIAGNOSIS — F0390 Unspecified dementia without behavioral disturbance: Secondary | ICD-10-CM | POA: Insufficient documentation

## 2012-05-09 DIAGNOSIS — J3489 Other specified disorders of nose and nasal sinuses: Secondary | ICD-10-CM | POA: Insufficient documentation

## 2012-05-09 DIAGNOSIS — IMO0002 Reserved for concepts with insufficient information to code with codable children: Secondary | ICD-10-CM | POA: Insufficient documentation

## 2012-05-09 DIAGNOSIS — F29 Unspecified psychosis not due to a substance or known physiological condition: Secondary | ICD-10-CM | POA: Insufficient documentation

## 2012-05-09 DIAGNOSIS — Z79899 Other long term (current) drug therapy: Secondary | ICD-10-CM | POA: Insufficient documentation

## 2012-05-09 DIAGNOSIS — Z8673 Personal history of transient ischemic attack (TIA), and cerebral infarction without residual deficits: Secondary | ICD-10-CM | POA: Insufficient documentation

## 2012-05-09 DIAGNOSIS — I2510 Atherosclerotic heart disease of native coronary artery without angina pectoris: Secondary | ICD-10-CM | POA: Insufficient documentation

## 2012-05-09 DIAGNOSIS — Z951 Presence of aortocoronary bypass graft: Secondary | ICD-10-CM | POA: Insufficient documentation

## 2012-05-09 DIAGNOSIS — I10 Essential (primary) hypertension: Secondary | ICD-10-CM | POA: Insufficient documentation

## 2012-05-09 LAB — BASIC METABOLIC PANEL
BUN: 17 mg/dL (ref 6–23)
CO2: 27 mEq/L (ref 19–32)
Calcium: 10.2 mg/dL (ref 8.4–10.5)
Chloride: 101 mEq/L (ref 96–112)
Creatinine, Ser: 1.13 mg/dL (ref 0.50–1.35)
GFR calc Af Amer: 70 mL/min — ABNORMAL LOW (ref >90)
GFR calc non Af Amer: 60 mL/min — ABNORMAL LOW (ref >90)
Glucose, Bld: 101 mg/dL — ABNORMAL HIGH (ref 70–99)
Potassium: 4.7 mEq/L (ref 3.5–5.1)
Sodium: 138 mEq/L (ref 135–145)

## 2012-05-09 LAB — URINALYSIS, ROUTINE W REFLEX MICROSCOPIC
Glucose, UA: NEGATIVE mg/dL
Hgb urine dipstick: NEGATIVE
Ketones, ur: 15 mg/dL — AB
Nitrite: NEGATIVE
Protein, ur: NEGATIVE mg/dL
Specific Gravity, Urine: 1.028 (ref 1.005–1.030)
Urobilinogen, UA: 0.2 mg/dL (ref 0.0–1.0)
pH: 5 (ref 5.0–8.0)

## 2012-05-09 LAB — CBC
HCT: 45.2 % (ref 39.0–52.0)
Hemoglobin: 16.3 g/dL (ref 13.0–17.0)
MCH: 34.1 pg — ABNORMAL HIGH (ref 26.0–34.0)
MCHC: 36.1 g/dL — ABNORMAL HIGH (ref 30.0–36.0)
MCV: 94.6 fL (ref 78.0–100.0)
Platelets: 211 10*3/uL (ref 150–400)
RBC: 4.78 MIL/uL (ref 4.22–5.81)
RDW: 13.3 % (ref 11.5–15.5)
WBC: 6.3 10*3/uL (ref 4.0–10.5)

## 2012-05-09 LAB — GLUCOSE, CAPILLARY: Glucose-Capillary: 87 mg/dL (ref 70–99)

## 2012-05-09 LAB — URINE MICROSCOPIC-ADD ON

## 2012-05-09 MED ORDER — IOHEXOL 300 MG/ML  SOLN
50.0000 mL | Freq: Once | INTRAMUSCULAR | Status: AC | PRN
  Administered 2012-05-09: 50 mL via ORAL

## 2012-05-09 MED ORDER — GUAIFENESIN-CODEINE 100-10 MG/5ML PO SOLN
10.0000 mL | Freq: Once | ORAL | Status: AC
  Administered 2012-05-09: 10 mL via ORAL
  Filled 2012-05-09 (×2): qty 5

## 2012-05-09 MED ORDER — SODIUM CHLORIDE 0.9 % IV BOLUS (SEPSIS)
500.0000 mL | Freq: Once | INTRAVENOUS | Status: AC
  Administered 2012-05-09: 500 mL via INTRAVENOUS

## 2012-05-09 MED ORDER — BENZONATATE 100 MG PO CAPS: 100.0000 mg | ORAL_CAPSULE | Freq: Three times a day (TID) | ORAL | Status: DC

## 2012-05-09 MED ORDER — IOHEXOL 300 MG/ML  SOLN
100.0000 mL | Freq: Once | INTRAMUSCULAR | Status: AC | PRN
  Administered 2012-05-09: 100 mL via INTRAVENOUS

## 2012-05-09 NOTE — Telephone Encounter (Signed)
Caller: Daphne/Sibling; Phone: [phone]; Reason for Call: Pt is still sitting in the waiting room at the ED.  Pt's spouse/family was under the impression Dr Para March would call the ED to give them a heads up about the pt and they wouldn't have to wait to be seen.  Pt's family is calling to get a name of the person Dr Para March had spoken with earlier so they won't continue to wait in the waiting room.  Please call back as soon as possible. Bard Herbert is the pt's sister and is waiting with pt and spouse in ED.

## 2012-05-09 NOTE — Telephone Encounter (Signed)
Pt's wife called in and pt's dementia behavior seems to be much worse today ("flopped" down on toilet and broke it, can't find shower). Per your advice, I advised her to take him to the emergency room.  Mrs. Neace plans to take him to [facility] ER and wanted you to know it will be after 2:00 p.m. Before she can get him there. She also wanted to see if you would call the ER and advise them of which tests needed to be completed. I told her I would pass the request on to you. Thank you.

## 2012-05-09 NOTE — ED Notes (Signed)
Cough meds given no coughing since he arrived here.  His wife reports that he has been confused and is the pt is asked questions he is not answering appropriately.  The pt is pleasant and co-operative.  At present the pt cannot give a urine specimen

## 2012-05-09 NOTE — Telephone Encounter (Signed)
I called.  I didn't recall the name of my contact at ER.  They verified that patient was arriving during my call, so we ended the conversation quickly to proceed with care at ER.  His labs are drawn and he should be recheck in the near future.  I appreciate ER help.

## 2012-05-09 NOTE — Telephone Encounter (Signed)
I called and patient had just arrived.  I was concerned that with an acute change, we wouldn't be able to get appropriate labs back in time to affect his situation today.  Thus, ER eval.  I thanked ER staff.

## 2012-05-09 NOTE — ED Notes (Signed)
Pt family states he has had a dry cough and nasal congestion for months and has alzheimers and seems more confused over past few days. Pt is able to breathe easily, pt denies any complaints, pleasant but confused

## 2012-05-09 NOTE — ED Notes (Signed)
Discuss prescription with patient and scheduling a follow up appointment with PCP

## 2012-05-09 NOTE — ED Provider Notes (Signed)
History    79 year old male with cough and congestion. Pt very pleasant but confused and poor historian. History primarily from family. This has been going on for a period of several months. Over the last 3 and half weeks, family has noted 3 episodes of what sounds like posttussive emesis. Patient will have a bad coughing spell and then subsequently vomit. Occasionally productive for whitish sputum. Complaining of dry mouth. Denies CP. No fever or chills. No vomiting or diarrhea. Also concerned that seems more confused recently. Hx of dementia. No recent medication changes. No acute trauma that they are aware of.   CSN: [id number]  Arrival date & time 05/09/12  1354   First MD Initiated Contact with Patient 05/09/12 1737      Chief Complaint  Patient presents with  . Cough    (Consider location/radiation/quality/duration/timing/severity/associated sxs/prior treatment) HPI  Past Medical History  Diagnosis Date  . Hypertension   . Hyperlipidemia   . Renal stones     stones  . Stroke   . CAD (coronary artery disease)   . Dementia     after CVA  . Foot drop, left   . Dementia     Past Surgical History  Procedure Laterality Date  . Appendectomy    . Tonsillectomy and adenoidectomy  1960's  . 3 back surgeries  1980's  . Pacemaker insertion  2000  . 5 bypass  04/29/2005  . Stroke during bypass surgery 04/29/2005 and another 05/17/05  2007  . Total knee replacement, left  10/07  . Total knee replacement, right  2/09  . 2nd pacemaker, generator (battery) replaced  01/08/11    Family History  Problem Relation Age of Onset  . Cancer Brother     stomach  . Heart disease Other   . Heart disease Mother   . Heart disease Father   . Colon cancer Neg Hx   . Prostate cancer Neg Hx     History  Substance Use Topics  . Smoking status: Never Smoker   . Smokeless tobacco: Not on file  . Alcohol Use: No      Review of Systems  All systems reviewed and negative, other than as  noted in HPI.   Allergies  Review of patient's allergies indicates no known allergies.  Home Medications   Current Outpatient Rx  Name  Route  Sig  Dispense  Refill  . calcium carbonate (OS-CAL) 600 MG TABS   Oral   Take 600 mg by mouth 2 (two) times daily with a meal.         . Cetirizine HCl 10 MG CAPS   Oral   Take 10 mg by mouth daily as needed. For allergies         . citalopram (CELEXA) 20 MG tablet   Oral   Take 20 mg by mouth every morning.           . dipyridamole-aspirin (AGGRENOX) 25-200 MG per 12 hr capsule   Oral   Take 1 capsule by mouth 2 (two) times daily.          Marland Kitchen donepezil (ARICEPT) 10 MG tablet   Oral   Take 10 mg by mouth every morning.         . fluticasone (FLONASE) 50 MCG/ACT nasal spray   Nasal   Place 2 sprays into the nose daily as needed (congestion).          . Glucosamine-Chondroitin (OSTEO BI-FLEX REGULAR STRENGTH) 250-200 MG TABS   Oral  Take 1 tablet by mouth every evening.         Marland Kitchen l-methylfolate-B6-B12 (METANX) 3-35-2 MG TABS   Oral   Take 1 tablet by mouth every morning.           . memantine (NAMENDA) 10 MG tablet   Oral   Take 10 mg by mouth 2 (two) times daily.           . metoprolol tartrate (LOPRESSOR) 25 MG tablet   Oral   Take 12.5 mg by mouth daily.         . simvastatin (ZOCOR) 40 MG tablet   Oral   Take 40 mg by mouth at bedtime.           Marland Kitchen tetrahydrozoline 0.05 % ophthalmic solution   Both Eyes   Place 2 drops into both eyes once.           BP 122/64  Pulse 75  Temp(Src) 98.2 F (36.8 C) (Oral)  Resp 15  SpO2 97%  Physical Exam  Nursing note and vitals reviewed. Constitutional: He appears well-developed and well-nourished. No distress.  HENT:  Head: Normocephalic and atraumatic.  Eyes: Conjunctivae are normal. Right eye exhibits no discharge. Left eye exhibits no discharge.  Neck: Neck supple.  Cardiovascular: Normal rate, regular rhythm and normal heart sounds.  Exam  reveals no gallop and no friction rub.   Sternotomy. Pacer.   Pulmonary/Chest: Effort normal and breath sounds normal. No respiratory distress.  Abdominal: Soft. He exhibits no distension. There is no tenderness.  Musculoskeletal: He exhibits no edema and no tenderness.  Lower extremities symmetric as compared to each other. No calf tenderness. Negative Homan's. No palpable cords.   Neurological: He is alert. No cranial nerve deficit.  Strength 5/5 b/l u/l extremities  Skin: Skin is warm and dry. He is not diaphoretic.  Psychiatric:  Pleasantly confused    ED Course  Procedures (including critical care time)  Labs Reviewed  CBC - Abnormal; Notable for the following:    MCH 34.1 (*)    MCHC 36.1 (*)    All other components within normal limits  BASIC METABOLIC PANEL - Abnormal; Notable for the following:    Glucose, Bld 101 (*)    GFR calc non Af Amer 60 (*)    GFR calc Af Amer 70 (*)    All other components within normal limits  GLUCOSE, CAPILLARY - Abnormal; Notable for the following:    Glucose-Capillary 100 (*)    All other components within normal limits  GLUCOSE, CAPILLARY  URINALYSIS, ROUTINE W REFLEX MICROSCOPIC   Dg Chest 2 View  05/09/2012  *RADIOLOGY REPORT*  Clinical Data: cough  CHEST - 2 VIEW  Comparison: 04/04/2012  Findings: Heart size is normal.  Previous median sternotomy and CABG procedure.  There is a right chest wall pacer device with lead in the right atrial appendage and right ventricle.  No pleural effusion or edema.  No airspace consolidation.  IMPRESSION:  1.  No acute cardiopulmonary abnormalities.   Original Report Authenticated By: Signa Kell, M.D.      1. Cough   2. Inguinal hernia, left       MDM  79 year old male with chronic sounding cough. Chest x-ray is clear. Patient is no acute distress on exam. He has good air movement. Oxygen saturations are good. Chest x-ray with no acute concerning findings. Possible viral illness, but symptoms  fairly prolonged for this. Consider gastroesophageal reflux or postnasal drip. Doubt PE. Very low  suspicion for emergent etiology. Also seems more confused than baseline. Aside from MS, neurologically seems to be at his baseline. No acute deficits. No hx of trauma. Neuro imaging likely low yield. Initial labs ordered unremarkable. Will check UA as well. Possibly progression of his dementia.    Upon discussing discharge intially, wife made mention of scrotal mass. Unsure of chronicity. On exam pt uncircumcised. Swelling of L hemiscrotum. No overlying skin changes.Nontender palpable mass consistent with inguinal hernia. Could be reduced but comes right back out. CT confirmed fat containing hernia. Surgical FU provided. Cough medication. Emergent return precautions discussed.     Raeford Razor, MD 05/09/12 [phone]

## 2012-05-09 NOTE — ED Notes (Signed)
CBG 100 

## 2012-05-11 ENCOUNTER — Ambulatory Visit (INDEPENDENT_AMBULATORY_CARE_PROVIDER_SITE_OTHER): Payer: Medicare HMO | Admitting: Family Medicine

## 2012-05-11 ENCOUNTER — Encounter: Payer: Self-pay | Admitting: Family Medicine

## 2012-05-11 VITALS — BP 110/60 | HR 84 | Temp 97.8°F

## 2012-05-11 DIAGNOSIS — K4090 Unilateral inguinal hernia, without obstruction or gangrene, not specified as recurrent: Secondary | ICD-10-CM | POA: Insufficient documentation

## 2012-05-11 DIAGNOSIS — F0390 Unspecified dementia without behavioral disturbance: Secondary | ICD-10-CM

## 2012-05-11 DIAGNOSIS — R05 Cough: Secondary | ICD-10-CM

## 2012-05-11 MED ORDER — DONEPEZIL HCL 23 MG PO TABS: 23.0000 mg | ORAL_TABLET | Freq: Every day | ORAL | Status: DC

## 2012-05-11 NOTE — Assessment & Plan Note (Signed)
In setting of dementia and w/o pain, I would not intervene.  D/w pt and wife.  Pt is unable to make his own decisions about this, but he denies pain.

## 2012-05-11 NOTE — Progress Notes (Signed)
Recently seen in ER.  Seen at neuro clinic today.  ER notes reviewed.   Wife notes progressive dementia. More sleeping, confusion.  occ hallucinations.  occ not remembering family members. He has fallen twice at home.  Per wife, "I can't leave him."  All worsened in the last few months.    Hernia noted at ER.  We discussed. No pain per patient report.    Cough noted by wife.  No fevers.  occ white sputum. Not sob and no inc in wob.   PMH and SH reviewed  ROS: See HPI, otherwise noncontributory.  Meds, vitals, and allergies reviewed.   nad Pleasant but not oriented x3 rrr Ctab, no focal dec in BS abd soft, not ttp Ext w/o edema

## 2012-05-11 NOTE — Assessment & Plan Note (Signed)
Possible gerd vs post nasal gtt, but also possible aspiration. D/w pt and wife.  Would work on aspiration precautions and follow clinically for now.  I wouldn't change his meds for now, unless the cough worsened, esp since his lungs are clear.

## 2012-05-11 NOTE — Assessment & Plan Note (Signed)
D/w pt and wife.  This is likely going to continue to worsen.  Wife will need to make decisions about home care. I gave her community resource number.  She'll call and see what is available.  I don't expect her to be able to care for him at home continually by herself.  I will await neuro clinic notes.  We talked about the progressive nature of dementia with likely aspiration and progress loss of speech, motor function, etc.  I'll await report back from patient's wife when she is able to investigate community resource. >25 min spent with face to face with patient, >50% counseling and/or coordinating care.

## 2012-05-11 NOTE — Patient Instructions (Addendum)
Continue to chop up meats.  Stay upright after meals and snacks.  Call [facility] [facility] [place].  [phone]. Take care.

## 2012-06-04 ENCOUNTER — Other Ambulatory Visit: Payer: Self-pay | Admitting: Neurology

## 2012-07-05 ENCOUNTER — Other Ambulatory Visit: Payer: Self-pay | Admitting: Cardiovascular Disease

## 2012-07-05 LAB — PACEMAKER DEVICE OBSERVATION

## 2012-07-19 ENCOUNTER — Encounter: Payer: Self-pay | Admitting: *Deleted

## 2012-07-19 LAB — REMOTE PACEMAKER DEVICE
AL IMPEDENCE PM: 393 Ohm
ATRIAL PACING PM: 89.7
BATTERY VOLTAGE: 2.77 V
VENTRICULAR PACING PM: 100

## 2012-07-26 ENCOUNTER — Emergency Department (HOSPITAL_COMMUNITY): Payer: Medicare HMO

## 2012-07-26 ENCOUNTER — Encounter (HOSPITAL_COMMUNITY): Payer: Self-pay | Admitting: *Deleted

## 2012-07-26 ENCOUNTER — Observation Stay (HOSPITAL_COMMUNITY)
Admission: EM | Admit: 2012-07-26 | Discharge: 2012-07-28 | Disposition: A | Discharge status: Home / Self Care | Payer: Medicare HMO | Attending: Internal Medicine | Admitting: Internal Medicine

## 2012-07-26 DIAGNOSIS — I2510 Atherosclerotic heart disease of native coronary artery without angina pectoris: Secondary | ICD-10-CM | POA: Insufficient documentation

## 2012-07-26 DIAGNOSIS — K4090 Unilateral inguinal hernia, without obstruction or gangrene, not specified as recurrent: Secondary | ICD-10-CM

## 2012-07-26 DIAGNOSIS — G319 Degenerative disease of nervous system, unspecified: Secondary | ICD-10-CM | POA: Insufficient documentation

## 2012-07-26 DIAGNOSIS — Z950 Presence of cardiac pacemaker: Secondary | ICD-10-CM | POA: Insufficient documentation

## 2012-07-26 DIAGNOSIS — N471 Phimosis: Secondary | ICD-10-CM

## 2012-07-26 DIAGNOSIS — R05 Cough: Secondary | ICD-10-CM

## 2012-07-26 DIAGNOSIS — I6789 Other cerebrovascular disease: Secondary | ICD-10-CM

## 2012-07-26 DIAGNOSIS — R42 Dizziness and giddiness: Secondary | ICD-10-CM

## 2012-07-26 DIAGNOSIS — F329 Major depressive disorder, single episode, unspecified: Secondary | ICD-10-CM

## 2012-07-26 DIAGNOSIS — F0390 Unspecified dementia without behavioral disturbance: Secondary | ICD-10-CM | POA: Diagnosis present

## 2012-07-26 DIAGNOSIS — J310 Chronic rhinitis: Secondary | ICD-10-CM

## 2012-07-26 DIAGNOSIS — M1710 Unilateral primary osteoarthritis, unspecified knee: Secondary | ICD-10-CM

## 2012-07-26 DIAGNOSIS — M25531 Pain in right wrist: Secondary | ICD-10-CM

## 2012-07-26 DIAGNOSIS — K209 Esophagitis, unspecified without bleeding: Secondary | ICD-10-CM | POA: Diagnosis present

## 2012-07-26 DIAGNOSIS — N209 Urinary calculus, unspecified: Secondary | ICD-10-CM

## 2012-07-26 DIAGNOSIS — A088 Other specified intestinal infections: Principal | ICD-10-CM | POA: Insufficient documentation

## 2012-07-26 DIAGNOSIS — G4730 Sleep apnea, unspecified: Secondary | ICD-10-CM

## 2012-07-26 DIAGNOSIS — R4182 Altered mental status, unspecified: Secondary | ICD-10-CM

## 2012-07-26 DIAGNOSIS — R1110 Vomiting, unspecified: Secondary | ICD-10-CM

## 2012-07-26 DIAGNOSIS — I10 Essential (primary) hypertension: Secondary | ICD-10-CM | POA: Insufficient documentation

## 2012-07-26 DIAGNOSIS — Z8673 Personal history of transient ischemic attack (TIA), and cerebral infarction without residual deficits: Secondary | ICD-10-CM | POA: Insufficient documentation

## 2012-07-26 DIAGNOSIS — Z96659 Presence of unspecified artificial knee joint: Secondary | ICD-10-CM | POA: Insufficient documentation

## 2012-07-26 DIAGNOSIS — E782 Mixed hyperlipidemia: Secondary | ICD-10-CM

## 2012-07-26 DIAGNOSIS — K219 Gastro-esophageal reflux disease without esophagitis: Secondary | ICD-10-CM | POA: Insufficient documentation

## 2012-07-26 DIAGNOSIS — R112 Nausea with vomiting, unspecified: Secondary | ICD-10-CM | POA: Insufficient documentation

## 2012-07-26 DIAGNOSIS — E785 Hyperlipidemia, unspecified: Secondary | ICD-10-CM | POA: Insufficient documentation

## 2012-07-26 DIAGNOSIS — Z79899 Other long term (current) drug therapy: Secondary | ICD-10-CM | POA: Insufficient documentation

## 2012-07-26 DIAGNOSIS — R269 Unspecified abnormalities of gait and mobility: Secondary | ICD-10-CM | POA: Insufficient documentation

## 2012-07-26 HISTORY — DX: Adverse effect of unspecified anesthetic, initial encounter: T41.45XA

## 2012-07-26 HISTORY — DX: Other complications of anesthesia, initial encounter: T88.59XA

## 2012-07-26 LAB — COMPREHENSIVE METABOLIC PANEL
ALT: 20 U/L (ref 0–53)
Albumin: 3.5 g/dL (ref 3.5–5.2)
Alkaline Phosphatase: 49 U/L (ref 39–117)
Chloride: 101 mEq/L (ref 96–112)
Glucose, Bld: 104 mg/dL — ABNORMAL HIGH (ref 70–99)
Potassium: 4 mEq/L (ref 3.5–5.1)
Sodium: 137 mEq/L (ref 135–145)
Total Bilirubin: 1.2 mg/dL (ref 0.3–1.2)
Total Protein: 6.6 g/dL (ref 6.0–8.3)

## 2012-07-26 LAB — URINALYSIS, ROUTINE W REFLEX MICROSCOPIC
Nitrite: NEGATIVE
Specific Gravity, Urine: 1.031 — ABNORMAL HIGH (ref 1.005–1.030)
Urobilinogen, UA: 1 mg/dL (ref 0.0–1.0)
pH: 5 (ref 5.0–8.0)

## 2012-07-26 LAB — CBC WITH DIFFERENTIAL/PLATELET
Basophils Relative: 0 % (ref 0–1)
Eosinophils Absolute: 0 10*3/uL (ref 0.0–0.7)
Lymphs Abs: 0.7 10*3/uL (ref 0.7–4.0)
MCH: 32.5 pg (ref 26.0–34.0)
Neutro Abs: 3.7 10*3/uL (ref 1.7–7.7)
Neutrophils Relative %: 78 % — ABNORMAL HIGH (ref 43–77)
Platelets: 149 10*3/uL — ABNORMAL LOW (ref 150–400)
RBC: 4.53 MIL/uL (ref 4.22–5.81)

## 2012-07-26 LAB — GLUCOSE, CAPILLARY

## 2012-07-26 LAB — URINE MICROSCOPIC-ADD ON

## 2012-07-26 MED ORDER — ACETAMINOPHEN 325 MG PO TABS: 650.0000 mg | ORAL_TABLET | Freq: Four times a day (QID) | ORAL | Status: DC | PRN

## 2012-07-26 MED ORDER — CLOPIDOGREL BISULFATE 75 MG PO TABS
75.0000 mg | ORAL_TABLET | Freq: Every day | ORAL | Status: DC
  Administered 2012-07-26 – 2012-07-28 (×3): 75 mg via ORAL
  Filled 2012-07-26 (×4): qty 1

## 2012-07-26 MED ORDER — POTASSIUM CHLORIDE IN NACL 20-0.9 MEQ/L-% IV SOLN
INTRAVENOUS | Status: DC
  Administered 2012-07-26: 19:00:00 via INTRAVENOUS
  Filled 2012-07-26 (×3): qty 1000

## 2012-07-26 MED ORDER — METOPROLOL TARTRATE 12.5 MG HALF TABLET
12.5000 mg | ORAL_TABLET | Freq: Every day | ORAL | Status: DC
  Administered 2012-07-26 – 2012-07-28 (×3): 12.5 mg via ORAL
  Filled 2012-07-26 (×3): qty 1

## 2012-07-26 MED ORDER — ACETAMINOPHEN 650 MG RE SUPP: 650.0000 mg | Freq: Four times a day (QID) | RECTAL | Status: DC | PRN

## 2012-07-26 MED ORDER — DONEPEZIL HCL 23 MG PO TABS
23.0000 mg | ORAL_TABLET | Freq: Every day | ORAL | Status: DC
  Administered 2012-07-26 – 2012-07-27 (×2): 23 mg via ORAL
  Filled 2012-07-26 (×3): qty 1

## 2012-07-26 MED ORDER — ENOXAPARIN SODIUM 40 MG/0.4ML ~~LOC~~ SOLN
40.0000 mg | SUBCUTANEOUS | Status: DC
  Administered 2012-07-27: 40 mg via SUBCUTANEOUS
  Filled 2012-07-26 (×3): qty 0.4

## 2012-07-26 MED ORDER — SIMVASTATIN 40 MG PO TABS
40.0000 mg | ORAL_TABLET | Freq: Every day | ORAL | Status: DC
  Administered 2012-07-26 – 2012-07-27 (×2): 40 mg via ORAL
  Filled 2012-07-26 (×3): qty 1

## 2012-07-26 MED ORDER — PANTOPRAZOLE SODIUM 40 MG PO TBEC
40.0000 mg | DELAYED_RELEASE_TABLET | Freq: Every day | ORAL | Status: DC
  Administered 2012-07-26 – 2012-07-28 (×3): 40 mg via ORAL
  Filled 2012-07-26 (×2): qty 1

## 2012-07-26 MED ORDER — CITALOPRAM HYDROBROMIDE 20 MG PO TABS
20.0000 mg | ORAL_TABLET | Freq: Every day | ORAL | Status: DC
  Administered 2012-07-27 – 2012-07-28 (×2): 20 mg via ORAL
  Filled 2012-07-26 (×2): qty 1

## 2012-07-26 NOTE — Progress Notes (Addendum)
Clinical Social Work Department CLINICAL SOCIAL WORK PLACEMENT NOTE 07/26/2012  Patient:  Kurt [name], Kurt [name]  Account Number:  [id number] Admit date:  07/26/2012  Clinical Social Worker:  Doree Albee  Date/time:  07/26/2012 06:01 PM  Clinical Social Work is seeking post-discharge placement for this patient at the following level of care:   SKILLED NURSING   (*CSW will update this form in Epic as items are completed)   07/26/2012  Patient/family provided with [facility] Department of Clinical Social Work's list of facilities offering this level of care within the geographic area requested by the patient (or if unable, by the patient's family).  07/26/2012  Patient/family informed of their freedom to choose among providers that offer the needed level of care, that participate in Medicare, Medicaid or managed care program needed by the patient, have an available bed and are willing to accept the patient.  07/26/2012  Patient/family informed of [facility]' ownership interest in [facility], as well as of the fact that they are under no obligation to receive care at this facility.  PASARR submitted to EDS on existing pasarr PASARR number received from EDS on existing pasarr  FL2 transmitted to all facilities in geographic area requested by pt/family on  07/27/2012 FL2 transmitted to all facilities within larger geographic area on   Patient informed that his/her managed care company has contracts with or will negotiate with  certain facilities, including the following:     Patient/family informed of bed offers received:  07/27/2012 Patient chooses bed at --- Physician recommends and patient chooses bed at    Patient to be transferred to  on  Home with home health services on 07/28/2012 Patient to be transferred to facility by pt wife via private vehicle  The following physician request were entered in Epic:   Additional Comments:    Kurt [name], MSW, LCSWA   Clinical Social Work [phone]

## 2012-07-26 NOTE — ED Provider Notes (Signed)
History     CSN: [id number]  Arrival date & time 07/26/12  1208   First MD Initiated Contact with Patient 07/26/12 1211      No chief complaint on file.   (Consider location/radiation/quality/duration/timing/severity/associated sxs/prior treatment) HPI Comments: Patient is a 79 year old male who presents today with worsening confusion over the past 3 weeks. He is currently no oriented to place or time. He is oriented only to person. He lives with his wife at his home in [place]. She gives most of the history and reports that he has become increasingly difficult to care for. He is ambulatory with a shuffling gait. He vomited last night which the wife is not sure if it was bloody or not because he vomited into a trash can with tomatoes in it. He also had an episode of urinary incontinence yesterday. He is not normally incontinent. No fevers, chills, abdominal pain, shortness of breath, chest pain.   The history is provided by the patient and the spouse. No language interpreter was used.    Past Medical History  Diagnosis Date  . Hypertension   . Hyperlipidemia   . Renal stones     stones  . Stroke   . CAD (coronary artery disease)   . Dementia     after CVA  . Foot drop, left   . Dementia     Past Surgical History  Procedure Laterality Date  . Appendectomy    . Tonsillectomy and adenoidectomy  1960's  . 3 back surgeries  1980's  . Pacemaker insertion  2000  . 5 bypass  04/29/2005  . Stroke during bypass surgery 04/29/2005 and another 05/17/05  2007  . Total knee replacement, left  10/07  . Total knee replacement, right  2/09  . 2nd pacemaker, generator (battery) replaced  01/08/11    Family History  Problem Relation Age of Onset  . Cancer Brother     stomach  . Heart disease Other   . Heart disease Mother   . Heart disease Father   . Colon cancer Neg Hx   . Prostate cancer Neg Hx     History  Substance Use Topics  . Smoking status: Never Smoker   . Smokeless  tobacco: Not on file  . Alcohol Use: No      Review of Systems  Constitutional: Negative for fever and chills.  Respiratory: Positive for cough (for the past year). Negative for shortness of breath.   Cardiovascular: Negative for chest pain.  Gastrointestinal: Positive for vomiting. Negative for nausea and abdominal pain.  All other systems reviewed and are negative.    Allergies  Review of patient's allergies indicates no known allergies.  Home Medications   Current Outpatient Rx  Name  Route  Sig  Dispense  Refill  . calcium carbonate (OS-CAL) 600 MG TABS   Oral   Take 600 mg by mouth 2 (two) times daily with a meal.         . Cetirizine HCl 10 MG CAPS   Oral   Take 10 mg by mouth daily as needed. For allergies         . citalopram (CELEXA) 20 MG tablet   Oral   Take 20 mg by mouth every morning.           . clopidogrel (PLAVIX) 75 MG tablet   Oral   Take 75 mg by mouth daily.         Marland Kitchen donepezil (ARICEPT) 23 MG TABS  tablet   Oral   Take 1 tablet (23 mg total) by mouth at bedtime.   30 tablet      . Glucosamine-Chondroitin (OSTEO BI-FLEX REGULAR STRENGTH) 250-200 MG TABS   Oral   Take 1 tablet by mouth every evening.         Marland Kitchen l-methylfolate-B6-B12 (METANX) 3-35-2 MG TABS   Oral   Take 1 tablet by mouth every morning.           Marland Kitchen NAMENDA XR 28 MG CP24      TAKE ONE CAPSULE EVERY DAY   90 capsule   3   . metoprolol tartrate (LOPRESSOR) 25 MG tablet   Oral   Take 12.5 mg by mouth daily.         . simvastatin (ZOCOR) 40 MG tablet   Oral   Take 40 mg by mouth at bedtime.             BP 113/54  Pulse 74  Temp(Src) 98 F (36.7 C) (Oral)  Resp 16  SpO2 95%  Physical Exam  Nursing note and vitals reviewed. Constitutional: He appears well-developed and well-nourished. He does not appear ill. No distress.  HENT:  Head: Normocephalic and atraumatic.  Right Ear: External ear normal.  Left Ear: External ear normal.  Nose: Nose  normal.  Mouth/Throat: Uvula is midline.  Eyes: Conjunctivae, EOM and lids are normal.  Neck: Trachea normal, normal range of motion and phonation normal. No tracheal deviation present.  Cardiovascular: Normal rate, regular rhythm, normal heart sounds, intact distal pulses and normal pulses.   Pulmonary/Chest: Effort normal and breath sounds normal. No stridor.  Abdominal: Soft. He exhibits no distension. There is no tenderness.  Genitourinary: Testes normal and penis normal. Uncircumcised.  Musculoskeletal: Normal range of motion.  Able to hold up legs on command bilaterally   Neurological: He is alert. He has normal strength. No sensory deficit. GCS eye subscore is 4. GCS verbal subscore is 5. GCS motor subscore is 6.  Oriented to person; able to name his wife in room   Skin: Skin is warm and dry. He is not diaphoretic.  Psychiatric: He has a normal mood and affect. His behavior is normal.    ED Course  Procedures (including critical care time)  Labs Reviewed  URINALYSIS, ROUTINE W REFLEX MICROSCOPIC - Abnormal; Notable for the following:    Color, Urine AMBER (*)    APPearance CLOUDY (*)    Specific Gravity, Urine 1.031 (*)    Bilirubin Urine SMALL (*)    Protein, ur 30 (*)    Leukocytes, UA TRACE (*)    All other components within normal limits  CBC WITH DIFFERENTIAL - Abnormal; Notable for the following:    Platelets 149 (*)    Neutrophils Relative % 78 (*)    All other components within normal limits  COMPREHENSIVE METABOLIC PANEL - Abnormal; Notable for the following:    Glucose, Bld 104 (*)    GFR calc non Af Amer 59 (*)    GFR calc Af Amer 68 (*)    All other components within normal limits  URINE MICROSCOPIC-ADD ON - Abnormal; Notable for the following:    Squamous Epithelial / LPF FEW (*)    Bacteria, UA FEW (*)    All other components within normal limits  CULTURE, BLOOD (ROUTINE X 2)  CULTURE, BLOOD (ROUTINE X 2)  STOOL CULTURE  OVA AND PARASITE EXAMINATION   GLUCOSE, CAPILLARY  LIPASE, BLOOD  GIARDIA/CRYPTOSPORIDIUM SCREEN(EIA)  POCT I-STAT TROPONIN I   Ct Head Wo Contrast  07/26/2012   *RADIOLOGY REPORT*  Clinical Data: Severe headache.  Dementia.  CT HEAD WITHOUT CONTRAST  Technique:  Contiguous axial images were obtained from the base of the skull through the vertex without contrast.  Comparison: CT 02/02/2011  Findings: Moderate to advanced atrophy.  Moderate to advanced chronic microvascular ischemic change in the white matter which is similar to the prior CT.  Negative for acute infarct, hemorrhage, or mass lesion.  No edema or shift of the midline structures.  No skull lesion.  Mucosal edema in the right maxillary sinus.  IMPRESSION: Moderate to advanced atrophy and chronic microvascular ischemia. No superimposed acute abnormality.   Original Report Authenticated By: Janeece Riggers, M.D.    Date: 07/26/2012  Rate: 73  Rhythm: atrial paced  QRS Axis: normal  Intervals: QT prolonged  ST/T Wave abnormalities: normal  Conduction Disutrbances:none  Narrative Interpretation:   Old EKG Reviewed: unchanged    1. Altered mental status       MDM  Patient presents with increasing confusion with baseline dementia over the past 2 days. Episode of vomiting and urinary incontinence. Likely delirium vs dementia. Blood cultures in ED. Hospitalist agrees to admit for 23 hr observation to continue to investigate cause for delirium. Discussed plan with the family who agree. Vital signs stable for transfer. Dr. Patria Mane evaluated this patient and agrees with plan. Patient / Family / Caregiver informed of clinical course, understand medical decision-making process, and agree with plan.         Mora Bellman, PA-C 07/26/12 2004

## 2012-07-26 NOTE — ED Provider Notes (Signed)
Medical screening examination/treatment/procedure(s) were conducted as a shared visit with non-physician practitioner(s) and myself.  I personally evaluated the patient during the encounter  Patient appears to have worsening mental confusion over the past 48 hours.  His had associated diarrhea.  He vomited once last night when eating.  His wife reports that over the last 2 mornings he's been sweaty and diaphoretic in the mornings and had "poor color".  The patient has a history of dementia this is a change from his baseline.  There is a concern by primary care team that he may be at any.  His had cough for several months and his wife states he continues to have thick productive sputum.  He is green and showed to me in the room.  His O2 sats are 93% on room air.  Chest x-ray pending at this time.  Labs without significant abnormality.  CT head without acute changes.  Given his acute change in mental status I think the patient would benefit from 23 hour observation for ongoing workup and management as this appears to be more delirium than it does exacerbation of dementia.  Ct Head Wo Contrast  07/26/2012   *RADIOLOGY REPORT*  Clinical Data: Severe headache.  Dementia.  CT HEAD WITHOUT CONTRAST  Technique:  Contiguous axial images were obtained from the base of the skull through the vertex without contrast.  Comparison: CT 02/02/2011  Findings: Moderate to advanced atrophy.  Moderate to advanced chronic microvascular ischemic change in the white matter which is similar to the prior CT.  Negative for acute infarct, hemorrhage, or mass lesion.  No edema or shift of the midline structures.  No skull lesion.  Mucosal edema in the right maxillary sinus.  IMPRESSION: Moderate to advanced atrophy and chronic microvascular ischemia. No superimposed acute abnormality.   Original Report Authenticated By: Janeece Riggers, M.D.  I personally reviewed the imaging tests through PACS system I reviewed available ER/hospitalization  records through the EMR   Lyanne Co, MD 07/26/12 [phone]

## 2012-07-26 NOTE — ED Notes (Signed)
Per EMS pt coming from home, Pt's family reports pt is having frequent episodes of confusion. Per EMS family reported pt has hx of Alzheimer's dementia.

## 2012-07-26 NOTE — H&P (Signed)
[facility] History and Physical  Kurt [name] ZOX:[id number] DOB: Feb 05, 1932 DOA: 07/26/2012  Referring physician: er PCP: Crawford Givens, MD  Specialists:   Chief Complaint: vomiting  HPI: Kurt [name] is a 79 y.o. male  Who comes in with vomiting x 3over the last few months- latest was last night after eating a tomato sandwich.  No blood per family.  This has happened several timed over th last few months.  He comes to the ER but has been sent home.  Wife also reports that he has had a shuffling gait worsening balance over the last month.  Wife also reports memory problems worsening but family friend says he is about normal.  +diarrhea No CP, no SOB, no nausea, no abd pain  Review of Systems: unable to do full ROS due to dementia    Past Medical History  Diagnosis Date  . Hypertension   . Hyperlipidemia   . Renal stones     stones  . Stroke   . CAD (coronary artery disease)   . Dementia     after CVA  . Foot drop, left   . Dementia   . Complication of anesthesia     trouble waking up in childhood   Past Surgical History  Procedure Laterality Date  . Appendectomy    . Tonsillectomy and adenoidectomy  1960's  . 3 back surgeries  1980's  . Pacemaker insertion  2000  . 5 bypass  04/29/2005  . Stroke during bypass surgery 04/29/2005 and another 05/17/05  2007  . Total knee replacement, left  10/07  . Total knee replacement, right  2/09  . 2nd pacemaker, generator (battery) replaced  01/08/11   Social History:  reports that he has never smoked. He has never used smokeless tobacco. He reports that he does not drink alcohol or use illicit drugs. From home  No Known Allergies  Family History  Problem Relation Age of Onset  . Cancer Brother     stomach  . Heart disease Other   . Heart disease Mother   . Heart disease Father   . Colon cancer Neg Hx   . Prostate cancer Neg Hx      Prior to Admission medications   Medication Sig Start Date End Date Taking?  Authorizing Provider  calcium carbonate (OS-CAL) 600 MG TABS Take 600 mg by mouth 2 (two) times daily with a meal.   Yes Historical Provider, MD  Cetirizine HCl 10 MG CAPS Take 10 mg by mouth daily as needed. For allergies   Yes Historical Provider, MD  citalopram (CELEXA) 20 MG tablet Take 20 mg by mouth every morning.     Yes Historical Provider, MD  clopidogrel (PLAVIX) 75 MG tablet Take 75 mg by mouth daily.   Yes Historical Provider, MD  donepezil (ARICEPT) 23 MG TABS tablet Take 1 tablet (23 mg total) by mouth at bedtime. 05/11/12  Yes Joaquim Nam, MD  Glucosamine-Chondroitin (OSTEO BI-FLEX REGULAR STRENGTH) 250-200 MG TABS Take 1 tablet by mouth every evening.   Yes Historical Provider, MD  l-methylfolate-B6-B12 (METANX) 3-35-2 MG TABS Take 1 tablet by mouth every morning.     Yes Historical Provider, MD  NAMENDA XR 28 MG CP24 TAKE ONE CAPSULE EVERY DAY 06/04/12  Yes Micki Riley, MD  metoprolol tartrate (LOPRESSOR) 25 MG tablet Take 12.5 mg by mouth daily.    Historical Provider, MD  simvastatin (ZOCOR) 40 MG tablet Take 40 mg by mouth at bedtime.  Historical Provider, MD   Physical Exam: Filed Vitals:   07/26/12 1211 07/26/12 1212  BP:  113/54  Pulse:  74  Temp:  98 F (36.7 C)  TempSrc:  Oral  Resp:  16  SpO2: 94% 95%     General:  Pleasant/cooperative  Eyes: wnl  ENT: wnl  Neck: supple  Cardiovascular: rrr  Respiratory: clear anterior  Abdomen: +BS, soft, NT  Skin: senile changes  Musculoskeletal: moves all 4 ext, no focal weakness  Psychiatric: demented  Neurologic: CN 2-12 intact  Labs on Admission:  Basic Metabolic Panel:  Recent Labs Lab 07/26/12 1256  NA 137  K 4.0  CL 101  CO2 28  GLUCOSE 104*  BUN 17  CREATININE 1.15  CALCIUM 9.2   Liver Function Tests:  Recent Labs Lab 07/26/12 1256  AST 23  ALT 20  ALKPHOS 49  BILITOT 1.2  PROT 6.6  ALBUMIN 3.5    Recent Labs Lab 07/26/12 1256  LIPASE 55   No results found for  this basename: AMMONIA,  in the last 168 hours CBC:  Recent Labs Lab 07/26/12 1256  WBC 4.7  NEUTROABS 3.7  HGB 14.7  HCT 41.6  MCV 91.8  PLT 149*   Cardiac Enzymes: No results found for this basename: CKTOTAL, CKMB, CKMBINDEX, TROPONINI,  in the last 168 hours  BNP (last 3 results) No results found for this basename: PROBNP,  in the last 8760 hours CBG:  Recent Labs Lab 07/26/12 1218  GLUCAP 98    Radiological Exams on Admission: Dg Chest 2 View  07/26/2012   *RADIOLOGY REPORT*  Clinical Data: Altered mental status  CHEST - 2 VIEW  Comparison: Prior chest x-ray 05/09/2012  Findings: Stable position of right subclavian approach cardiac rhythm maintenance device with leads projecting over the right atrium and right ventricle.  Stable cardiac and mediastinal contours.  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  The lungs are clear and well aerated.  No consolidation, effusion, pneumothorax or edema.  Degenerative changes noted in the bilateral shoulder joints.  No acute osseous abnormality.  IMPRESSION: No acute cardiopulmonary process.   Original Report Authenticated By: Malachy Moan, M.D.   Ct Head Wo Contrast  07/26/2012   *RADIOLOGY REPORT*  Clinical Data: Severe headache.  Dementia.  CT HEAD WITHOUT CONTRAST  Technique:  Contiguous axial images were obtained from the base of the skull through the vertex without contrast.  Comparison: CT 02/02/2011  Findings: Moderate to advanced atrophy.  Moderate to advanced chronic microvascular ischemic change in the white matter which is similar to the prior CT.  Negative for acute infarct, hemorrhage, or mass lesion.  No edema or shift of the midline structures.  No skull lesion.  Mucosal edema in the right maxillary sinus.  IMPRESSION: Moderate to advanced atrophy and chronic microvascular ischemia. No superimposed acute abnormality.   Original Report Authenticated By: Janeece Riggers, M.D.       Assessment/Plan Active Problems:   Esophagitis   Dementia   Vomiting   Vomiting- ? Etiology, ? Post tussive, does appear to be digested food per history not regurgitation of food, monitor over night- consider GI consult in patient or as outpatient SLP eval  GERD- protonix  Dementia- per family seems to be baseline  Gait disturbance- unable to have MRI due to pacemaker, PT eval   Code Status: full Family Communication: patient/wife at bedside Disposition Plan: obs  Time spent: 70 min  Benjamine Mola Karra Pink [facility] Pager [phone]  If  7PM-7AM, please contact night-coverage [email or web address] Password [facility] 07/26/2012, 4:42 PM

## 2012-07-26 NOTE — ED Notes (Signed)
ZOX:[id number]<AV> Expected date:<BR> Expected time:<BR> Means of arrival:<BR> Comments:<BR> 79yo dementia

## 2012-07-26 NOTE — ED Notes (Signed)
Attempted to call floor for report, they stated they will call back shortly.

## 2012-07-26 NOTE — Progress Notes (Signed)
Clinical Social Work Department BRIEF PSYCHOSOCIAL ASSESSMENT 07/26/2012  Patient:  Kurt [name], Kurt [name]     Account Number:  [id number]     Admit date:  07/26/2012  Clinical Social Worker:  Doree Albee  Date/Time:  07/26/2012 05:30 PM  Referred by:  Physician  Date Referred:  07/26/2012 Referred for  SNF Placement  ALF Placement   Other Referral:   Interview type:  Patient Other interview type:   and patient wife    PSYCHOSOCIAL DATA Living Status:  FAMILY Admitted from facility:   Level of care:   Primary support name:  Kurt [name] Primary support relationship to patient:  SPOUSE Degree of support available:   strong    CURRENT CONCERNS Current Concerns  Post-Acute Placement   Other Concerns:    SOCIAL WORK ASSESSMENT / PLAN CSW met with pt and pt wife at bedside. Patient only alert and oriented to self at this time. CSW and wife spoke outside of patient room regarding referral for assisted living and skilled nursing placement. CSw introduced self and csw role.    Patient wife shared that patient has worsening dementia, and it is becoming more difficult to provide care for patient. patient wife is interested in possibly short term rehab in a skilled nursing facility or respite care in an assited living facility. Pt wife states that at this time, they are not looking for long term placement, as patient family especially children do not wish for pt to be placed long term.    Patient wife states that patient can not be left alone, and will sometimes try to leave the house. patient wife shared patient has been weaker, and began to shuffle his feet when attempting to walk. CSW and pt wife discussed options of skilled nursing for short term rehab or assisted living for respite. CSW provided patient wife with skilled nursing facility list and assisted living list. CSW will begin snf search for short term rehab pending insurance authorization. Patient is pending physical  therapy evaluation to determine pt disposition needs. Please see placement note for progress.   Assessment/plan status:  Psychosocial Support/Ongoing Assessment of Needs Other assessment/ plan:   Information/referral to community resources:   skilled nursing and assisted living facility list.    PATIENT'S/FAMILY'S RESPONSE TO PLAN OF CARE: Pt spouse thanked csw for concern and support. Pt spouse is interested in short term rehab at a skilled nursing facility if recommended by physical therapy.   Kurt [name], Kurt [name]  [phone] 07/26/2012 [phone]

## 2012-07-27 DIAGNOSIS — I2510 Atherosclerotic heart disease of native coronary artery without angina pectoris: Secondary | ICD-10-CM

## 2012-07-27 DIAGNOSIS — J310 Chronic rhinitis: Secondary | ICD-10-CM

## 2012-07-27 DIAGNOSIS — I6789 Other cerebrovascular disease: Secondary | ICD-10-CM

## 2012-07-27 LAB — CBC
HCT: 39.7 % (ref 39.0–52.0)
MCH: 30.9 pg (ref 26.0–34.0)
MCV: 91.7 fL (ref 78.0–100.0)
Platelets: 152 10*3/uL (ref 150–400)
RDW: 12.9 % (ref 11.5–15.5)

## 2012-07-27 LAB — BASIC METABOLIC PANEL
BUN: 15 mg/dL (ref 6–23)
CO2: 28 mEq/L (ref 19–32)
Calcium: 8.9 mg/dL (ref 8.4–10.5)
Chloride: 104 mEq/L (ref 96–112)
Creatinine, Ser: 1.17 mg/dL (ref 0.50–1.35)
Glucose, Bld: 93 mg/dL (ref 70–99)

## 2012-07-27 NOTE — Progress Notes (Signed)
CSW and RNCM followed up with pt wife at bedside. Pt sister-in-law also present at this time.  CSW provided SNF bed offers and discussed that recommendation for PT is for SNF rehab.  Pt wife plans to discuss with pt children this evening and have decision re: disposition plan for CSW and RNCM in AM.  CSW and RNCM discussed that pt remains observation and anticipate d/c tomorrow.   CSW to follow up with pt wife in the morning re: plan for discharge.   Jacklynn Lewis, MSW, LCSWA  Clinical Social Work [phone]

## 2012-07-27 NOTE — Evaluation (Signed)
Clinical/Bedside Swallow Evaluation Patient Details  Name: Kurt [name] MRN: [id number] Date of Birth: March 12, 1931  Today's Date: 07/27/2012 Time: 1015-1055 SLP Time Calculation (min): 40 min  Past Medical History:  Past Medical History  Diagnosis Date  . Hypertension   . Hyperlipidemia   . Renal stones     stones  . Stroke   . CAD (coronary artery disease)   . Dementia     after CVA  . Foot drop, left   . Dementia   . Complication of anesthesia     trouble waking up in childhood   Past Surgical History:  Past Surgical History  Procedure Laterality Date  . Appendectomy    . Tonsillectomy and adenoidectomy  1960's  . 3 back surgeries  1980's  . Pacemaker insertion  2000  . 5 bypass  04/29/2005  . Stroke during bypass surgery 04/29/2005 and another 05/17/05  2007  . Total knee replacement, left  10/07  . Total knee replacement, right  2/09  . 2nd pacemaker, generator (battery) replaced  01/08/11   HPI:  Pt. comes in with vomiting x 3over the last few months- latest was last night after eating a tomato sandwich.  No blood per family.  This has happened several timed over th last few months.  He comes to the ER but has been sent home.  Wife also reports that he has had a shuffling gait worsening balance over the last month.  Wife also reports memory problems worsening but family friend says he is about normal.  +diarrhea   Assessment / Plan / Recommendation Clinical Impression  Pt. appears to be tolerating po's without s/s of aspiration.  There was one instance of a brief cough after sip of thin liquid, but pt. was able to drink 2 cups of water and a cup of cranberry juice without any s/s of aspiration.  Pt. ate a cup of fruit cocktail, cheese and crackers, and applesauce without difficulty.  Pt. and wife report he has no problem with eating/drinking.  Lung sounds are clear, but diminished, and pt. is afebrile.    Aspiration Risk  Mild    Diet Recommendation Regular;Thin  liquid   Liquid Administration via: Cup;Straw Medication Administration:  (As tolerated) Supervision: Patient able to self feed;Intermittent supervision to cue for compensatory strategies Compensations: Slow rate;Small sips/bites Postural Changes and/or Swallow Maneuvers: Seated upright 90 degrees    Other  Recommendations Oral Care Recommendations: Oral care BID   Follow Up Recommendations  24 hour supervision/assistance    Frequency and Duration min 1 x/week  1 week   Pertinent Vitals/Pain n/a    SLP Swallow Goals Patient will consume recommended diet without observed clinical signs of aspiration with: Set-up;Supervision/safety Patient will utilize recommended strategies during swallow to increase swallowing safety with: Supervision/safety;Set-up   Swallow Study Prior Functional Status       General HPI: Pt. comes in with vomiting x 3over the last few months- latest was last night after eating a tomato sandwich.  No blood per family.  This has happened several timed over th last few months.  He comes to the ER but has been sent home.  Wife also reports that he has had a shuffling gait worsening balance over the last month.  Wife also reports memory problems worsening but family friend says he is about normal.  +diarrhea Type of Study: Bedside swallow evaluation Previous Swallow Assessment: None Diet Prior to this Study: Regular;Thin liquids Temperature Spikes Noted: No Respiratory Status: Room air  History of Recent Intubation: No Behavior/Cognition: Alert;Cooperative;Pleasant mood;Requires cueing Oral Cavity - Dentition: Adequate natural dentition Self-Feeding Abilities: Able to feed self;Needs set up Patient Positioning: Upright in bed Baseline Vocal Quality: Clear Volitional Cough: Strong Volitional Swallow: Able to elicit    Oral/Motor/Sensory Function Overall Oral Motor/Sensory Function: Impaired at baseline Lingual Symmetry: Abnormal symmetry left   Ice Chips Ice  chips: Within functional limits Presentation: Spoon   Thin Liquid Thin Liquid: Impaired Presentation: Spoon;Cup;Straw Pharyngeal  Phase Impairments: Cough - Immediate (x1 sip, out of 3 cups of thin liquid)    Nectar Thick Nectar Thick Liquid: Not tested   Honey Thick     Puree Puree: Within functional limits Presentation: Spoon;Self Fed   Solid   GO Functional Assessment Tool Used: Clinical judgement Functional Limitations: Swallowing Swallow Current Status [phone]): At least 1 percent but less than 20 percent impaired, limited or restricted Swallow Goal Status [phone]): At least 1 percent but less than 20 percent impaired, limited or restricted Swallow Discharge Status [phone]): 0 percent impaired, limited or restricted  Solid: Within functional limits (ate cheese and cracker without difficulty) Presentation: Self Daine Gravel, Ibraheem Voris T 07/27/2012,2:41 PM

## 2012-07-27 NOTE — Evaluation (Signed)
Physical Therapy Evaluation Patient Details Name: Kurt [name] MRN: [id number] DOB: [birth year] Today's Date: 07/27/2012 Time: 4098-1191 PT Time Calculation (min): 30 min  PT Assessment / Plan / Recommendation Clinical Impression  79 y/o male admitted from home with h/o vomiting and increased confusion. OPt has h/o dementia. Pt found in stool, IV had been pulled out. Pt is pleasantly confused and states he has not been married, could not recall wife's name. Pt was able to get up and stand. , did not walk at the time for safety as only 1 person assist available. Feel pt will be able to ambulate with 1 person and RW. No family present  for prior function and DC plans. Chart indicates SNF/ALF. Pt will benefit from PT while inacute care.    PT Assessment  Patient needs continued PT services    Follow Up Recommendations  SNF    Does the patient have the potential to tolerate intense rehabilitation      Barriers to Discharge Decreased caregiver support      Equipment Recommendations  None recommended by PT    Recommendations for Other Services     Frequency Min 3X/week    Precautions / Restrictions Precautions Precautions: Fall Restrictions Weight Bearing Restrictions: No   Pertinent Vitals/Pain      Mobility  Bed Mobility Bed Mobility: Rolling Right;Rolling Left;Supine to Sit;Sit to Supine Rolling Right: 5: Supervision Rolling Left: 5: Supervision Supine to Sit: 4: Min assist Sit to Supine: 5: Supervision Transfers Transfers: Sit to Stand;Stand to Sit Sit to Stand: From elevated surface;With upper extremity assist;From bed;3: Mod assist Stand to Sit: To bed;4: Min assist;With upper extremity assist Details for Transfer Assistance: cues for standing up at RW, Pt had difficulty from low bed, improved from higher bed height. Ambulation/Gait Ambulation/Gait Assistance: 3: Mod assist Ambulation Distance (Feet): 4 Feet Assistive device: Rolling walker Ambulation/Gait  Assistance Details: 4 sidesteps to get higher in bed prior to lying down Gait Pattern: Step-to pattern    Exercises     PT Diagnosis: Generalized weakness;Difficulty walking;Altered mental status  PT Problem List: Decreased activity tolerance;Decreased mobility;Decreased cognition;Decreased knowledge of use of DME;Decreased safety awareness;Decreased knowledge of precautions PT Treatment Interventions: DME instruction;Functional mobility training;Therapeutic activities;Therapeutic exercise;Patient/family education   PT Goals Acute Rehab PT Goals PT Goal Formulation: Patient unable to participate in goal setting Time For Goal Achievement: 08/10/12 Potential to Achieve Goals: Good Pt will go Supine/Side to Sit: with supervision PT Goal: Supine/Side to Sit - Progress: Goal set today Pt will go Sit to Supine/Side: with supervision PT Goal: Sit to Supine/Side - Progress: Goal set today Pt will go Sit to Stand: with supervision PT Goal: Sit to Stand - Progress: Goal set today Pt will go Stand to Sit: with supervision PT Goal: Stand to Sit - Progress: Goal set today Pt will Ambulate: >150 feet;with supervision;with rolling walker  Visit Information  Last PT Received On: 07/27/12 Assistance Needed: +1    Subjective Data  Subjective: I am not married. I am in [place]. Patient Stated Goal: pt unable to participate   Prior Functioning  Home Living Lives With: Spouse Additional Comments: wife not present for info. Per chart looking into SNF/ALF Prior Function Comments: info not available Communication Communication: No difficulties    Cognition  Cognition Arousal/Alertness: Awake/alert Behavior During Therapy: Restless Overall Cognitive Status: No family/caregiver present to determine baseline cognitive functioning Area of Impairment: Orientation;Memory;Safety/judgement Orientation Level: Place;Time;Situation Safety/Judgement: Decreased awareness of safety General Comments: pt  found in bed incontinent  of b/b and had pulled IV out. RN notified.    Extremity/Trunk Assessment Right Upper Extremity Assessment RUE ROM/Strength/Tone: [facility] for tasks assessed Left Upper Extremity Assessment LUE ROM/Strength/Tone: WFL for tasks assessed Right Lower Extremity Assessment RLE ROM/Strength/Tone: WFL for tasks assessed Left Lower Extremity Assessment LLE ROM/Strength/Tone: WFL for tasks assessed   Balance Balance Balance Assessed: Yes Static Sitting Balance Static Sitting - Balance Support: No upper extremity supported Static Sitting - Level of Assistance: 5: Stand by assistance Static Standing Balance Static Standing - Balance Support: Bilateral upper extremity supported Static Standing - Level of Assistance: 4: Min assist Static Standing - Comment/# of Minutes: at [employer]  End of Session PT - End of Session Activity Tolerance: Patient tolerated treatment well Patient left: in bed;with call bell/phone within reach;with bed alarm set Nurse Communication: Mobility status (iv out)  GP Functional Assessment Tool Used: clinical judgement Functional Limitation: Mobility: Walking and moving around Mobility: Walking and Moving Around Current Status ([id number]): At least 40 percent but less than 60 percent impaired, limited or restricted Mobility: Walking and Moving Around Goal Status [phone]): At least 1 percent but less than 20 percent impaired, limited or restricted   Rada Hay 07/27/2012, 9:15 AM  Blanchard Kelch PT [phone]

## 2012-07-27 NOTE — Progress Notes (Signed)
Physical Therapy Treatment Patient Details Name: Kurt [name] MRN: [id number] DOB: Feb 04, 1932 Today's Date: 07/27/2012 Time: 4098-1191 PT Time Calculation (min): 19 min  PT Assessment / Plan / Recommendation Comments on Treatment Session  Pt's wife present and observed pt ambulating. Wife reports multiple falls, that she is only caregiver, has friends come by, usually takes pt when she goes out.  There 3 steps to enter and wife states that pt has fallen on the steps. Pt continues to be high fall risk and requires assistance to walk. Wife  states she has to speak to family re: discharge.    Follow Up Recommendations  SNF     Does the patient have the potential to tolerate intense rehabilitation     Barriers to Discharge        Equipment Recommendations  None recommended by PT    Recommendations for Other Services    Frequency Min 3X/week   Plan      Precautions / Restrictions Precautions Precautions: Fall   Pertinent Vitals/Pain     Mobility  Bed Mobility Supine to Sit: With rails;4: Min guard Transfers Sit to Stand: 4: Min assist;From elevated surface;With upper extremity assist Stand to Sit: With armrests;4: Min assist Details for Transfer Assistance: cues for reaching back. Ambulation/Gait Ambulation/Gait Assistance: 4: Min assist Ambulation Distance (Feet): 250 Feet Ambulation/Gait Assistance Details: pt required occassional steady assistance when turning with RW Gait Pattern: Step-through pattern Gait velocity: grossly WFL    Exercises     PT Diagnosis:    PT Problem List:   PT Treatment Interventions:     PT Goals Acute Rehab PT Goals Pt will go Supine/Side to Sit: with supervision PT Goal: Supine/Side to Sit - Progress: Progressing toward goal Pt will go Sit to Stand: with supervision PT Goal: Sit to Stand - Progress: Progressing toward goal Pt will go Stand to Sit: with supervision PT Goal: Stand to Sit - Progress: Progressing toward goal Pt will  Ambulate: >150 feet;with supervision;with rolling walker PT Goal: Ambulate - Progress: Progressing toward goal  Visit Information  Last PT Received On: 07/27/12 Assistance Needed: +1    Subjective Data  Subjective: That's my wife.   Cognition  Cognition Area of Impairment: Orientation;Memory;Safety/judgement Orientation Level: Place;Time;Situation Safety/Judgement: Decreased awareness of safety General Comments: pt continues to be confused.    Balance  Static Standing Balance Static Standing - Balance Support: Bilateral upper extremity supported Static Standing - Level of Assistance: 4: Min assist  End of Session PT - End of Session Equipment Utilized During Treatment: Gait belt Activity Tolerance: Patient tolerated treatment well Patient left: in chair;with family/visitor present Nurse Communication: Mobility status (pt is up with family present.)   GP     Rada Hay 07/27/2012, 2:37 PM  Blanchard Kelch PT [phone]

## 2012-07-27 NOTE — Care Management Note (Signed)
Cm spoke with patient concerning discharge planning. CM asked by CSw to consult patient and spouse concerning community resources in pt & spouse choice to discharge home with HH/ private duty services as per previous conversation between spouse and CSW. CM provided spouse with choice list for [facility], private duty provider list, and information concerning [facility]. PT recommendation for SNF. Awaiting final disposition.   Roxy Manns Shealee Yordy,RN,BSN [phone]

## 2012-07-27 NOTE — Progress Notes (Signed)
Patient ID: Kurt [name], male   DOB: [birth year], 79 y.o.   MRN: [id number]  [facility] PROGRESS NOTE  Kurt [name] ZDG:[id number] DOB: [birth year] DOA: 07/26/2012 PCP: Crawford Givens, MD  Brief narrative: Kurt [name] is 79 y.o. male with dementia presented to [facility] ED with main concern of progressively worsening vomiting x 3 episodes over the last few months- latest was one night prior to admission after eating a tomato sandwich. No blood per family. This has happened several timed over the last few months. Wife also reports that he has had a shuffling gait, worsening balance over the last month. Wife also reports memory problems worsening.   Active Problems:   Nausea and vomiting - unclear etiology at this time - Kurt [name] denies any symptoms this AM, electrolytes are stable and within normal limits - supportive care with analgesia and antiemetics as needed   Dementia - appears to be at his baseline - SNF placement in progress   Consultants:  None  Procedures/Studies: Dg Chest 2 View 07/26/2012 No acute cardiopulmonary process.    Ct Head Wo Contrast 07/26/2012  Moderate to advanced atrophy and chronic microvascular ischemia. No superimposed acute abnormality.     Antibiotics:  None  Code Status: Full Family Communication: Kurt [name] at bedside Disposition Plan: SNF likely in AM  HPI/Subjective: No events overnight.   Objective: Filed Vitals:   07/26/12 1700 07/26/12 1755 07/27/12 0510 07/27/12 1403  BP: 129/74 121/65 113/52 120/60  Pulse: 71 78 70 72  Temp: 98.2 F (36.8 C) 97.6 F (36.4 C) 97.8 F (36.6 C) 97.6 F (36.4 C)  TempSrc: Oral Oral Oral Oral  Resp: 18 18 16 16   Height:  6\' 1"  (1.854 m)    Weight:  100.1 kg (220 lb 10.9 oz)    SpO2: 98% 98% 98% 98%    Intake/Output Summary (Last 24 hours) at 07/27/12 1905 Last data filed at 07/27/12 1843  Gross per 24 hour  Intake    720 ml  Output      0 ml  Net    720 ml    Exam:   General:  Kurt [name] is alert, follows  commands appropriately, not in acute distress  Cardiovascular: Regular rate and rhythm, S1/S2, no murmurs, no rubs, no gallops  Respiratory: Clear to auscultation bilaterally, no wheezing, no crackles, no rhonchi  Abdomen: Soft, non tender, non distended, bowel sounds present, no guarding  Extremities: No edema, pulses DP and Kurt [name] palpable bilaterally  Neuro: Grossly non focal, alert only to name  Data Reviewed: Basic Metabolic Panel:  Recent Labs Lab 07/26/12 1256 07/27/12 0517  NA 137 138  K 4.0 4.1  CL 101 104  CO2 28 28  GLUCOSE 104* 93  BUN 17 15  CREATININE 1.15 1.17  CALCIUM 9.2 8.9   Liver Function Tests:  Recent Labs Lab 07/26/12 1256  AST 23  ALT 20  ALKPHOS 49  BILITOT 1.2  PROT 6.6  ALBUMIN 3.5    Recent Labs Lab 07/26/12 1256  LIPASE 55   No results found for this basename: AMMONIA,  in the last 168 hours CBC:  Recent Labs Lab 07/26/12 1256 07/27/12 0517  WBC 4.7 4.7  NEUTROABS 3.7  --   HGB 14.7 13.4  HCT 41.6 39.7  MCV 91.8 91.7  PLT 149* 152   CBG:  Recent Labs Lab 07/26/12 1218  GLUCAP 98    Scheduled Meds: . citalopram  20 mg Oral Daily  . clopidogrel  75 mg Oral QAC breakfast  .  donepezil  23 mg Oral QHS  . enoxaparin (LOVENOX) injection  40 mg Subcutaneous Q24H  . metoprolol tartrate  12.5 mg Oral Daily  . pantoprazole  40 mg Oral Daily  . simvastatin  40 mg Oral QHS   Continuous Infusions: . 0.9 % NaCl with KCl 20 mEq / L 50 mL/hr at 07/26/12 1610   Debbora Presto, MD  [facility] Pager [phone]  If 7PM-7AM, please contact night-coverage [email or web address] Password [email or web address] 07/27/2012, 7:05 PM   LOS: 1 day

## 2012-07-27 NOTE — ED Provider Notes (Signed)
Medical screening examination/treatment/procedure(s) were conducted as a shared visit with non-physician practitioner(s) and myself.  I personally evaluated the patient during the encounter  Please see my note for complete details.  Acute onset worsening of his mental status with some new fecal incontinence, nausea and vomiting, chills and sweats in the morning.  This is concerning for delirium.  No obvious source for his infection was noted.  Patient be admitted for 23 our observation by the hospitalist.  Blood and urine cultures pending  The primary encounter diagnosis was Altered mental status. Diagnoses of Esophagitis, Essential hypertension, benign, and Dementia, without behavioral disturbance were also pertinent to this visit. Dg Chest 2 View  07/26/2012   *RADIOLOGY REPORT*  Clinical Data: Altered mental status  CHEST - 2 VIEW  Comparison: Prior chest x-ray 05/09/2012  Findings: Stable position of right subclavian approach cardiac rhythm maintenance device with leads projecting over the right atrium and right ventricle.  Stable cardiac and mediastinal contours.  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  The lungs are clear and well aerated.  No consolidation, effusion, pneumothorax or edema.  Degenerative changes noted in the bilateral shoulder joints.  No acute osseous abnormality.  IMPRESSION: No acute cardiopulmonary process.   Original Report Authenticated By: Malachy Moan, M.D.   Ct Head Wo Contrast  07/26/2012   *RADIOLOGY REPORT*  Clinical Data: Severe headache.  Dementia.  CT HEAD WITHOUT CONTRAST  Technique:  Contiguous axial images were obtained from the base of the skull through the vertex without contrast.  Comparison: CT 02/02/2011  Findings: Moderate to advanced atrophy.  Moderate to advanced chronic microvascular ischemic change in the white matter which is similar to the prior CT.  Negative for acute infarct, hemorrhage, or mass lesion.  No edema or shift  of the midline structures.  No skull lesion.  Mucosal edema in the right maxillary sinus.  IMPRESSION: Moderate to advanced atrophy and chronic microvascular ischemia. No superimposed acute abnormality.   Original Report Authenticated By: Janeece Riggers, M.D.  I personally reviewed the imaging tests through PACS system I reviewed available ER/hospitalization records through the EMR   Lyanne Co, MD 07/27/12 0710

## 2012-07-27 NOTE — Progress Notes (Signed)
Pt. Pulled out IV MD notified and orders were given to leave IV out.

## 2012-07-27 NOTE — Progress Notes (Addendum)
CSW met with pt wife at bedside to discuss SNF as disposition.   Pt wife reports that pt family preference is for an adult day care/respite care during the day. CSW discussed that RNCM can provide Adult Day Care list and private duty care list, but some Adult Day Cares can have a waiting list and pt wife may still want to consider ST rehab at SNF in order to get further plans in place for taking pt home.  CSW asked RNCM to see pt wife as well and CSW and RNCM further discussed options including home with private duty and [facility] services vs. SNF.   Pt wife is hopeful to see pt work with PT in order to make decision about disposition.  Pt wife is agreeable to SNF search in [place] and [place] [employer] [place].  SNF search to [facility] had been initiated and CSW initiated search to [facility].  CSW and RNCM to follow up with pt wife following PT evaluation with pt and discuss SNF bed offers at that time.  Pt has [employer] Medicare which requires authorization prior to d/c to SNF, if SNF is disposition that pt wife desires.   Jacklynn Lewis, MSW, LCSWA  Clinical Social Work [phone]

## 2012-07-28 LAB — GIARDIA/CRYPTOSPORIDIUM SCREEN(EIA): Cryptosporidium Screen (EIA): NEGATIVE

## 2012-07-28 LAB — OVA AND PARASITE EXAMINATION

## 2012-07-28 NOTE — Progress Notes (Signed)
Pt was stable at time of discharge. Reviewed discharge packet with pt's wife. Pt left with commode. Speech therapist recommended GI consult due to pt still having difficulty with swallowing/ weak esophagus muscles. Wrote note on pt's discharge packet so wife and pt can discuss this with their PCP.

## 2012-07-28 NOTE — Care Management Note (Signed)
Cm spoke with patient's spouse concerning discharge planning. Per pt's spouse discharge disposition is home with home health care services provided by [facility]. Per pt's spouse adult children and family friends will assist in home care. CM provided pt's spouse with private duty care list & adult day care information to further assist in home care. [facility] rep Lanae Crumbly notified of new referral. No other needs identified. Pt's spouse to provide tx home.   Roxy Manns Chantella Creech,RN,BSN [phone]

## 2012-07-28 NOTE — Discharge Summary (Signed)
Physician Discharge Summary  Kurt [name]:[id number] DOB: 1931-11-11 DOA: 07/26/2012  PCP: Crawford Givens, MD  Admit date: 07/26/2012 Discharge date: 07/28/2012  Recommendations for Outpatient Follow-up:  1. Pt will need to follow up with PCP in 2-3 weeks post discharge 2. Please obtain BMP to evaluate electrolytes and kidney function 3. Please also check CBC to evaluate Hg and Hct levels  Discharge Diagnoses: Viral gastroenteritis  Active Problems:   Esophagitis   Dementia   Vomiting   Discharge Condition: Stable  Diet recommendation: Heart healthy diet discussed in details   Brief narrative:  Pt is 79 y.o. male with dementia presented to [facility] ED with main concern of progressively worsening vomiting x 3 episodes over the last few months- latest was one night prior to admission after eating a tomato sandwich. No blood per family. This has happened several timed over the last few months. Wife also reports that he has had a shuffling gait, worsening balance over the last month. Wife also reports memory problems worsening.   Active Problems:  Nausea and vomiting  - unclear etiology at this time, possibly viral in etiology   - pt denies any symptoms this AM, electrolytes are stable and within normal limits  - supportive care with analgesia and antiemetics as needed provided and pt tolerating well, his symptoms resolved and he is tolerating regular diet well - he wants to go home today  Dementia  - appears to be at his baseline  - family agreeable to discharge home with [facility] PT, RN, AID  Consultants:  None Procedures/Studies:  Dg Chest 2 View  07/26/2012 No acute cardiopulmonary process.  Ct Head Wo Contrast  07/26/2012 Moderate to advanced atrophy and chronic microvascular ischemia. No superimposed acute abnormality.  Antibiotics:  None  Code Status: Full  Family Communication: Pt and wife at bedside   Discharge Exam: Filed Vitals:   07/28/12 0436  BP: 125/64  Pulse:  75  Temp: 97.6 F (36.4 C)  Resp: 16   Filed Vitals:   07/27/12 0510 07/27/12 1403 07/27/12 2133 07/28/12 0436  BP: 113/52 120/60 113/53 125/64  Pulse: 70 72 70 75  Temp: 97.8 F (36.6 C) 97.6 F (36.4 C) 97.9 F (36.6 C) 97.6 F (36.4 C)  TempSrc: Oral Oral Oral Oral  Resp: 16 16 16 16   Height:      Weight:      SpO2: 98% 98% 98% 99%    General: Pt is alert, follows commands appropriately, not in acute distress Cardiovascular: Regular rate and rhythm, S1/S2 +, no murmurs, no rubs, no gallops Respiratory: Clear to auscultation bilaterally, no wheezing, no crackles, no rhonchi Abdominal: Soft, non tender, non distended, bowel sounds +, no guarding Extremities: no edema, no cyanosis, pulses palpable bilaterally DP and PT Neuro: Grossly nonfocal  Discharge Instructions  Discharge Orders   Future Appointments Provider Department Dept Phone   10/23/2012 6:05 AM [facility] [employer] [place] [phone]   11/14/2012 2:30 PM Nilda Riggs, NP [facility] [phone]   Future Orders Complete By Expires     Diet - low sodium heart healthy  As directed     Increase activity slowly  As directed         Medication List    TAKE these medications       calcium carbonate 600 MG Tabs  Commonly known as:  OS-CAL  Take 600 mg by mouth 2 (two) times daily with a meal.  Cetirizine HCl 10 MG Caps  Take 10 mg by mouth daily as needed. For allergies     citalopram 20 MG tablet  Commonly known as:  CELEXA  Take 20 mg by mouth every morning.     clopidogrel 75 MG tablet  Commonly known as:  PLAVIX  Take 75 mg by mouth daily.     donepezil 23 MG Tabs tablet  Commonly known as:  ARICEPT  Take 1 tablet (23 mg total) by mouth at bedtime.     l-methylfolate-B6-B12 3-35-2 MG Tabs  Commonly known as:  METANX  Take 1 tablet by mouth every morning.     metoprolol tartrate 25 MG tablet  Commonly known as:   LOPRESSOR  Take 12.5 mg by mouth daily.     NAMENDA XR 28 MG Cp24  Generic drug:  Memantine HCl ER  TAKE ONE CAPSULE EVERY DAY     OSTEO BI-FLEX REGULAR STRENGTH 250-200 MG Tabs  Generic drug:  Glucosamine-Chondroitin  Take 1 tablet by mouth every evening.     simvastatin 40 MG tablet  Commonly known as:  ZOCOR  Take 40 mg by mouth at bedtime.           Follow-up Information   Follow up with Crawford Givens, MD In 2 weeks.   Contact information:   [address] [place] [address] [phone]        The results of significant diagnostics from this hospitalization (including imaging, microbiology, ancillary and laboratory) are listed below for reference.     Microbiology: Recent Results (from the past 240 hour(s))  CULTURE, BLOOD (ROUTINE X 2)     Status: None   Collection Time    07/26/12  3:55 PM      Result Value Range Status   Specimen Description BLOOD LEFT WRIST   Final   Special Requests BOTTLES DRAWN AEROBIC AND ANAEROBIC   Final   Culture  Setup Time 07/27/2012 00:46   Final   Culture     Final   Value:        BLOOD CULTURE RECEIVED NO GROWTH TO DATE CULTURE WILL BE HELD FOR 5 DAYS BEFORE ISSUING A FINAL NEGATIVE REPORT   Report Status PENDING   Incomplete  CULTURE, BLOOD (ROUTINE X 2)     Status: None   Collection Time    07/26/12  4:00 PM      Result Value Range Status   Specimen Description BLOOD LEFT HAND   Final   Special Requests BOTTLES DRAWN AEROBIC AND ANAEROBIC   Final   Culture  Setup Time 07/27/2012 00:46   Final   Culture     Final   Value:        BLOOD CULTURE RECEIVED NO GROWTH TO DATE CULTURE WILL BE HELD FOR 5 DAYS BEFORE ISSUING A FINAL NEGATIVE REPORT   Report Status PENDING   Incomplete  STOOL CULTURE     Status: None   Collection Time    07/26/12  7:49 PM      Result Value Range Status   Specimen Description STOOL   Final   Special Requests NONE   Final   Culture Culture reincubated for better growth   Final    Report Status PENDING   Incomplete     Labs: Basic Metabolic Panel:  Recent Labs Lab 07/26/12 1256 07/27/12 0517  NA 137 138  K 4.0 4.1  CL 101 104  CO2 28 28  GLUCOSE 104* 93  BUN 17 15  CREATININE 1.15 1.17  CALCIUM 9.2 8.9   Liver Function Tests:  Recent Labs Lab 07/26/12 1256  AST 23  ALT 20  ALKPHOS 49  BILITOT 1.2  PROT 6.6  ALBUMIN 3.5    Recent Labs Lab 07/26/12 1256  LIPASE 55   CBC:  Recent Labs Lab 07/26/12 1256 07/27/12 0517  WBC 4.7 4.7  NEUTROABS 3.7  --   HGB 14.7 13.4  HCT 41.6 39.7  MCV 91.8 91.7  PLT 149* 152   CBG:  Recent Labs Lab 07/26/12 1218  GLUCAP 98     SIGNED: Time coordinating discharge: Over 30 minutes  Debbora Presto, MD  [facility] 07/28/2012, 12:21 PM Pager [phone]  If 7PM-7AM, please contact night-coverage [email or web address] Password [email or web address]

## 2012-07-28 NOTE — Progress Notes (Addendum)
CSW followed up with pt and pt wife at bedside.  Pt wife stated that pt family has decided that they would like pt to return home with home health services.   CSW discussed that home health services have social workers if pt family unable to manage pt at home then the home health social worker could assist with placement from home if needed.  Pt spouse expressed appreciation for CSW assistance.  No further social work needs identified at this time.  CSW signing off.   Addendum 12pm: CSW received notification from RN that pt wife wanted to now see if [facility] was available for pt. CSW contacted [facility] who stated that they no longer had bed availability for today. CSW discussed with pt wife at bedside and explored other bed offers. Pt wife stated that she would like for pt to return home with home health and will attempt placement in [facility] from home with assistance from [facility] SW. CSW notified [facility] rep in order to ensure that [facility] SW would follow up with family.  No further social work needs identified.  CSW signed off.    Jacklynn Lewis, MSW, LCSWA  Clinical Social Work [phone]

## 2012-07-30 LAB — STOOL CULTURE

## 2012-08-02 LAB — CULTURE, BLOOD (ROUTINE X 2): Culture: NO GROWTH

## 2012-08-08 ENCOUNTER — Ambulatory Visit: Payer: Medicare HMO | Admitting: Family Medicine

## 2012-08-09 ENCOUNTER — Encounter: Payer: Self-pay | Admitting: Family Medicine

## 2012-08-09 ENCOUNTER — Ambulatory Visit (INDEPENDENT_AMBULATORY_CARE_PROVIDER_SITE_OTHER): Payer: Medicare HMO | Admitting: Family Medicine

## 2012-08-09 VITALS — BP 116/60 | HR 92 | Temp 97.8°F | Wt 225.0 lb

## 2012-08-09 DIAGNOSIS — F0390 Unspecified dementia without behavioral disturbance: Secondary | ICD-10-CM

## 2012-08-09 DIAGNOSIS — R1110 Vomiting, unspecified: Secondary | ICD-10-CM

## 2012-08-09 NOTE — Patient Instructions (Addendum)
Go to the lab on the way out.  We'll contact you with your lab report.  See Shirlee Limerick about your referral before you leave today. Take care.

## 2012-08-10 LAB — CBC WITH DIFFERENTIAL/PLATELET
Basophils Relative: 0.3 % (ref 0.0–3.0)
Eosinophils Absolute: 0.1 10*3/uL (ref 0.0–0.7)
Eosinophils Relative: 1.4 % (ref 0.0–5.0)
Lymphocytes Relative: 17 % (ref 12.0–46.0)
MCHC: 34.4 g/dL (ref 30.0–36.0)
Monocytes Absolute: 0.4 10*3/uL (ref 0.1–1.0)
Neutrophils Relative %: 75.6 % (ref 43.0–77.0)
Platelets: 218 10*3/uL (ref 150.0–400.0)
RBC: 4.48 Mil/uL (ref 4.22–5.81)
WBC: 6.8 10*3/uL (ref 4.5–10.5)

## 2012-08-10 LAB — BASIC METABOLIC PANEL
BUN: 12 mg/dL (ref 6–23)
Calcium: 9.5 mg/dL (ref 8.4–10.5)
Creatinine, Ser: 1.1 mg/dL (ref 0.4–1.5)

## 2012-08-10 NOTE — Assessment & Plan Note (Signed)
Recheck cbc and bmet pending. He had swallow eval as inpatient. I want GI input on EGD.  Referral placed.  Wife and patient agree. >25 min spent with face to face with patient, >50% counseling and/or coordinating care

## 2012-08-10 NOTE — Progress Notes (Signed)
Admitted with vomiting, hospital notes reviewed.  He has still been spitting up white/yellow material intermittently after swallowing.  No blood in vomitus or stool.  No abd pain.  Noted loose stools, usually once daily.    In meantime, his memory loss is stable.  His gait has improved somewhat, in PT at home.  Using walker/cane.  He has f/u with neuro 10/2012.   Meds, vitals, and allergies reviewed.   ROS: See HPI.  Otherwise, noncontributory.  nad Mmm rrr ctab abd soft, not ttp Ext w/o edema.

## 2012-08-10 NOTE — Assessment & Plan Note (Signed)
Near baseline, has PT for gait abnormalities and has routine fu with neuro pending.

## 2012-08-11 ENCOUNTER — Encounter: Payer: Self-pay | Admitting: *Deleted

## 2012-08-27 ENCOUNTER — Encounter: Payer: Self-pay | Admitting: Family Medicine

## 2012-08-27 DIAGNOSIS — K219 Gastro-esophageal reflux disease without esophagitis: Secondary | ICD-10-CM | POA: Insufficient documentation

## 2012-09-05 ENCOUNTER — Encounter: Payer: Self-pay | Admitting: Family Medicine

## 2012-09-06 ENCOUNTER — Encounter: Payer: Self-pay | Admitting: Cardiovascular Disease

## 2012-09-23 ENCOUNTER — Other Ambulatory Visit: Payer: Self-pay | Admitting: Cardiovascular Disease

## 2012-09-25 NOTE — Telephone Encounter (Signed)
Rx was sent to pharmacy electronically. 

## 2012-09-29 ENCOUNTER — Other Ambulatory Visit: Payer: Self-pay | Admitting: Cardiovascular Disease

## 2012-09-29 DIAGNOSIS — I495 Sick sinus syndrome: Secondary | ICD-10-CM | POA: Diagnosis not present

## 2012-09-29 LAB — PACEMAKER DEVICE OBSERVATION

## 2012-10-09 ENCOUNTER — Encounter: Payer: Self-pay | Admitting: *Deleted

## 2012-10-09 LAB — REMOTE PACEMAKER DEVICE
AL IMPEDENCE PM: 398 Ohm
AL THRESHOLD: 0.5 V
BAMS-0001: 150 {beats}/min
BATTERY VOLTAGE: 2.77 V
RV LEAD THRESHOLD: 1.5 V

## 2012-10-26 ENCOUNTER — Telehealth: Payer: Self-pay | Admitting: Nurse Practitioner

## 2012-10-27 NOTE — Telephone Encounter (Signed)
I called pts wife and she is out of namenda XR due to backorder.   She is questioning whether this is helping along with aricept.   I told her that the medication is hopefully arresting the progression of dementia.  She will p/u up samples and will see CM/NP on 11-14-12 for RV.  She was ok with this.

## 2012-11-14 ENCOUNTER — Ambulatory Visit (INDEPENDENT_AMBULATORY_CARE_PROVIDER_SITE_OTHER): Payer: Commercial Managed Care - HMO | Admitting: Nurse Practitioner

## 2012-11-14 ENCOUNTER — Encounter: Payer: Self-pay | Admitting: Nurse Practitioner

## 2012-11-14 VITALS — BP 106/67 | HR 79 | Ht 72.0 in | Wt 217.0 lb

## 2012-11-14 DIAGNOSIS — R42 Dizziness and giddiness: Secondary | ICD-10-CM

## 2012-11-14 DIAGNOSIS — F0150 Vascular dementia without behavioral disturbance: Secondary | ICD-10-CM

## 2012-11-14 DIAGNOSIS — F0390 Unspecified dementia without behavioral disturbance: Secondary | ICD-10-CM

## 2012-11-14 DIAGNOSIS — I6789 Other cerebrovascular disease: Secondary | ICD-10-CM

## 2012-11-14 NOTE — Progress Notes (Signed)
[facility]  PATIENT: Kurt [name] DOB: [birth year]   REASON FOR VISIT: For severe dementia and stroke   HISTORY OF PRESENT ILLNESS:80 year male with prior history of Memory loss and  vascular dementia since  stroke following cardiac surgery in 2007 which has slowly declined. MMSE 11/30 today. Orthostatic dizziness and balance difficulties likely multifactorial, he continues to fall on occasion.  He is accompanied by his wife and son. Patient has had continued to decline over the last 6 months, having more hallucinations which are more bothersome to the wife then they are to the patient . He needs more assistance with activities of daily living like, he can no longer shower alone,  dress, or toilet himself without assistance.  He is currently on Aricept 23 mg without significant GI side effects. He is on  Namenda XR 28 mg daily.  He gets lost easily has trouble finding anything in the house and short-term memory remains quite poor.  He is on  Citalopram for his anxiety/depression.  He is using a 4 point cane today.On Plavix from [employer] because insurance will no longer pay unless  he fails Plavix. Wife currently does not have any in-home assistance. She does not want him placed    REVIEW OF SYSTEMS: Full 14 system review of systems performed and notable only for:  Constitutional: N/A  Cardiovascular: N/A  Ear/Nose/Throat: N/A  Skin: N/A  Eyes: N/A  Respiratory: N/A  Gastroitestinal: N/A  Hematology/Lymphatic: N/A  Endocrine: N/A Musculoskeletal:N/A  Allergy/Immunology: N/A  Neurological: Severe memory loss,  Psychiatric: Hallucinations   ALLERGIES: No Known Allergies  HOME MEDICATIONS: Outpatient Prescriptions Prior to Visit  Medication Sig Dispense Refill  . calcium carbonate (OS-CAL) 600 MG TABS Take 600 mg by mouth 2 (two) times daily with a meal.      . citalopram (CELEXA) 20 MG tablet Take 20 mg by mouth every morning.        . clopidogrel  (PLAVIX) 75 MG tablet Take 75 mg by mouth daily.      Marland Kitchen donepezil (ARICEPT) 23 MG TABS tablet Take 1 tablet (23 mg total) by mouth at bedtime.  30 tablet    . Glucosamine-Chondroitin (OSTEO BI-FLEX REGULAR STRENGTH) 250-200 MG TABS Take 1 tablet by mouth every evening.      Marland Kitchen l-methylfolate-B6-B12 (METANX) 3-35-2 MG TABS Take 1 tablet by mouth every morning.        . metoprolol tartrate (LOPRESSOR) 25 MG tablet Take 12.5 mg by mouth 2 (two) times daily.       Marland Kitchen NAMENDA XR 28 MG CP24 TAKE ONE CAPSULE EVERY DAY  90 capsule  3  . simvastatin (ZOCOR) 40 MG tablet TAKE 1 TABLET BY MOUTH EVERY DAY  90 tablet  2  . Cetirizine HCl 10 MG CAPS Take 10 mg by mouth daily as needed. For allergies       No facility-administered medications prior to visit.    PAST MEDICAL HISTORY: Past Medical History  Diagnosis Date  . Hypertension   . Hyperlipidemia   . Renal stones     stones  . Stroke   . CAD (coronary artery disease)   . Dementia     after CVA  . Foot drop, left   . Dementia   . Complication of anesthesia     trouble waking up in childhood    PAST SURGICAL HISTORY: Past Surgical History  Procedure Laterality Date  . Appendectomy    . Tonsillectomy and adenoidectomy  1960's  .  3 back surgeries  1980's  . Pacemaker insertion  2000  . 5 bypass  04/29/2005  . Stroke during bypass surgery 04/29/2005 and another 05/17/05  2007  . Total knee replacement, left  10/07  . Total knee replacement, right  2/09  . 2nd pacemaker, generator (battery) replaced  01/08/11    FAMILY HISTORY: Family History  Problem Relation Age of Onset  . Cancer Brother     stomach  . Heart disease Other   . Heart disease Mother   . Heart disease Father   . Colon cancer Neg Hx   . Prostate cancer Neg Hx     SOCIAL HISTORY: History   Social History  . Marital Status: Married    Spouse Name: N/A    Number of Children: N/A  . Years of Education: N/A   Occupational History  . Retired Radio broadcast assistant     Social History Main Topics  . Smoking status: Never Smoker   . Smokeless tobacco: Never Used  . Alcohol Use: No  . Drug Use: No  . Sexual Activity: Not on file   Other Topics Concern  . Not on file   Social History Narrative   Education:  12th grade   Married 1957   Retired Surveyor, minerals   From [employer]     PHYSICAL EXAM  Filed Vitals:   11/14/12 1435  BP: 106/67  Pulse: 79  Height: 6' (1.829 m)  Weight: 217 lb (98.431 kg)   Body mass index is 29.42 kg/(m^2).  Generalized: Well developed, in no acute distress  Head: normocephalic and atraumatic,. Oropharynx benign  Neck: Supple, no carotid bruits  Cardiac: Regular rate rhythm, no murmur     Neurological examination   Mentation: Alert, MMSE 11/30. Missing items and orientation, attention and calculation, recall, writing a sentence and copying a figure. AFT 0 Does not follow all commands,  Word finding difficulties Cranial nerve II-XII: .Pupils were equal round reactive to light extraocular movements were full, unable to perform visual field testing, hearing was intact to finger rubbing bilaterally. Uvula tongue midline. head turning and shoulder shrug and were normal and symmetric.Tongue protrusion into cheek strength was normal. Motor: normal bulk and tone, full strength in the BUE, BLE, fine finger movements normal, no pronator drift. No focal weakness. Mild essential tremor Coordination: Unable to perform  Reflexes: Depressed and symmetric upper and lower Gait and Station: Rising up from seated position without assistance, wide based  stance, ataxic gait cannot tandem, unsteady with turns   DIAGNOSTIC DATA (LABS, IMAGING, TESTING) - I reviewed patient records, labs, notes, testing and imaging myself where available.  Lab Results  Component Value Date   WBC 6.8 08/09/2012   HGB 15.0 08/09/2012   HCT 43.8 08/09/2012   MCV 97.6 08/09/2012   PLT 218.0 08/09/2012      Component Value Date/Time   NA 138 08/09/2012  1609   K 4.1 08/09/2012 1609   CL 103 08/09/2012 1609   CO2 31 08/09/2012 1609   GLUCOSE 89 08/09/2012 1609   BUN 12 08/09/2012 1609   CREATININE 1.1 08/09/2012 1609   CALCIUM 9.5 08/09/2012 1609   PROT 6.6 07/26/2012 1256   ALBUMIN 3.5 07/26/2012 1256   AST 23 07/26/2012 1256   ALT 20 07/26/2012 1256   ALKPHOS 49 07/26/2012 1256   BILITOT 1.2 07/26/2012 1256   GFRNONAA 58* 07/27/2012 0517   GFRAA 67* 07/27/2012 0517     ASSESSMENT AND PLAN  79 y.o. year old male  has a past medical history of Hypertension; Hyperlipidemia; Renal stones; Stroke; CAD (coronary artery disease); Dementia; Foot drop, left; and Complication of anesthesia. here to followup on his severe dementia and history of stroke  Pt to continue Aricept Pt to continue  Namenda Pt to continue Plavix Additional 20 minutes spent face to face with wife and son discussing  long term care planning, wife is completely worn out and does not wish to place patient however is not receptive to in home care. Given a list of available options. She is agreeable to getting help for 4 to 5 hrs daily.   F/U in 6 months   Nilda Riggs, [facility], [facility], APRN  [facility] [address] [place], [place] [address] [phone]

## 2012-11-14 NOTE — Patient Instructions (Addendum)
Pt to continue Aricept Pt to continue  Namenda Pt to continue Plavix Discussed long term care planning F/U in 6 months

## 2012-11-15 ENCOUNTER — Telehealth: Payer: Self-pay | Admitting: Nurse Practitioner

## 2012-11-15 ENCOUNTER — Encounter: Payer: Self-pay | Admitting: Internal Medicine

## 2012-11-15 ENCOUNTER — Ambulatory Visit (INDEPENDENT_AMBULATORY_CARE_PROVIDER_SITE_OTHER)
Admission: RE | Admit: 2012-11-15 | Discharge: 2012-11-15 | Disposition: A | Discharge status: Home / Self Care | Payer: Medicare HMO | Source: Ambulatory Visit | Attending: Internal Medicine | Admitting: Internal Medicine

## 2012-11-15 ENCOUNTER — Ambulatory Visit (INDEPENDENT_AMBULATORY_CARE_PROVIDER_SITE_OTHER): Payer: Medicare HMO | Admitting: Internal Medicine

## 2012-11-15 VITALS — BP 108/70 | HR 75 | Temp 97.6°F | Ht 72.0 in | Wt 216.8 lb

## 2012-11-15 DIAGNOSIS — Z9181 History of falling: Secondary | ICD-10-CM

## 2012-11-15 DIAGNOSIS — R296 Repeated falls: Secondary | ICD-10-CM

## 2012-11-15 DIAGNOSIS — R05 Cough: Secondary | ICD-10-CM

## 2012-11-15 DIAGNOSIS — R4182 Altered mental status, unspecified: Secondary | ICD-10-CM

## 2012-11-15 DIAGNOSIS — F0390 Unspecified dementia without behavioral disturbance: Secondary | ICD-10-CM

## 2012-11-15 DIAGNOSIS — R400 Somnolence: Secondary | ICD-10-CM

## 2012-11-15 DIAGNOSIS — J3489 Other specified disorders of nose and nasal sinuses: Secondary | ICD-10-CM

## 2012-11-15 DIAGNOSIS — R404 Transient alteration of awareness: Secondary | ICD-10-CM

## 2012-11-15 DIAGNOSIS — R0989 Other specified symptoms and signs involving the circulatory and respiratory systems: Secondary | ICD-10-CM

## 2012-11-15 DIAGNOSIS — H109 Unspecified conjunctivitis: Secondary | ICD-10-CM

## 2012-11-15 NOTE — Assessment & Plan Note (Signed)
Agree with CT head.  Have asked for this to be call report to RB.  Will address the CT results when available- to be done shortly per report.

## 2012-11-15 NOTE — Progress Notes (Signed)
Subjective:    Patient ID: Kurt [name], male    DOB: Jun 21, 1931, 79 y.o.   MRN: [id number]  HPI  Pt presents to the clinic today with c/o a cough and runny nose. This started 3 days ago. He is coughing up thin white sputum. He does have a chronic cough. He also has chronic rhinitis. He is on Zyrtec daily. He denies fever, chills or body aches.  Additionally, he did fall twice yesterday. 1 occurred while he was trying to change clothes. He sat down on the bench at the end of the bed. He leaned over too far and fell off. The 2nd fall occurred out of bed at 3:30 this am. He did hit his head on the wall and know has a scrape to the forehead. Per his wife, he seems more confused and weak. He has slept most of the day. She also reports that he vomited in the car on the way over here. He does have a history of stroke x 2. He was also recently hospitalized in 06/2012 for recurrent vomiting. He was treated for GERD and has followed up with GI for an EGD. Results are not in EMR.  Review of Systems      Past Medical History  Diagnosis Date  . Hypertension   . Hyperlipidemia   . Renal stones     stones  . Stroke   . CAD (coronary artery disease)   . Dementia     after CVA  . Foot drop, left   . Dementia   . Complication of anesthesia     trouble waking up in childhood    Current Outpatient Prescriptions  Medication Sig Dispense Refill  . calcium carbonate (OS-CAL) 600 MG TABS Take 600 mg by mouth 2 (two) times daily with a meal.      . Cetirizine HCl (ZYRTEC ALLERGY) 10 MG CAPS Take 10 mg by mouth daily.      . citalopram (CELEXA) 20 MG tablet Take 20 mg by mouth every morning.        . clopidogrel (PLAVIX) 75 MG tablet Take 75 mg by mouth daily.      Marland Kitchen donepezil (ARICEPT) 23 MG TABS tablet Take 1 tablet (23 mg total) by mouth at bedtime.  30 tablet    . Glucosamine-Chondroitin (OSTEO BI-FLEX REGULAR STRENGTH) 250-200 MG TABS Take 1 tablet by mouth every evening.      Marland Kitchen  L-Methylfolate-Algae-B12-B6 (METANX) 3-90.314-2-35 MG CAPS       . l-methylfolate-B6-B12 (METANX) 3-35-2 MG TABS Take 1 tablet by mouth every morning.        . metoprolol tartrate (LOPRESSOR) 25 MG tablet Take 12.5 mg by mouth 2 (two) times daily.       Marland Kitchen MICRONIZED COLESTIPOL HCL 1 G tablet daily. 1-2 daily      . NAMENDA XR 28 MG CP24 TAKE ONE CAPSULE EVERY DAY  90 capsule  3  . simvastatin (ZOCOR) 40 MG tablet TAKE 1 TABLET BY MOUTH EVERY DAY  90 tablet  2   No current facility-administered medications for this visit.    No Known Allergies  Family History  Problem Relation Age of Onset  . Cancer Brother     stomach  . Heart disease Other   . Heart disease Mother   . Heart disease Father   . Colon cancer Neg Hx   . Prostate cancer Neg Hx     History   Social History  . Marital Status: Married  Spouse Name: N/A    Number of Children: N/A  . Years of Education: N/A   Occupational History  . Retired Radio broadcast assistant    Social History Main Topics  . Smoking status: Never Smoker   . Smokeless tobacco: Never Used  . Alcohol Use: No  . Drug Use: No  . Sexual Activity: Not on file   Other Topics Concern  . Not on file   Social History Narrative   Education:  12th grade   Married 1957   Retired Surveyor, minerals   From [employer]     Constitutional: Wife reports fatigue. Denies fever, malaise, headache or abrupt weight changes.  HEENT: Wife reports runny nose. Denies eye pain, eye redness, ear pain, ringing in the ears, wax buildup, nasal congestion, bloody nose, or sore throat. Respiratory: Wife reports cough. Denies difficulty breathing, shortness of breath.   Cardiovascular: Denies chest pain, chest tightness, palpitations or swelling in the hands or feet.  Gastrointestinal: Wife reports vomiting. Denies abdominal pain, bloating, constipation, diarrhea or blood in the stool.  Musculoskeletal: Wife reports generalized weakness. Denies  muscle pain or joint pain  and swelling.  Skin: Wife reports scrape to forehead. Denies redness, rashes, lesions or ulcercations.  Neurological: Wife reports confusion, AMS.  No other specific complaints in a complete review of systems (except as listed in HPI above).  Objective:   Physical Exam   BP 108/70  Pulse 75  Temp(Src) 97.6 F (36.4 C) (Oral)  Ht 6' (1.829 m)  Wt 216 lb 12 oz (98.317 kg)  BMI 29.39 kg/m2 Wt Readings from Last 3 Encounters:  11/15/12 216 lb 12 oz (98.317 kg)  11/14/12 217 lb (98.431 kg)  08/09/12 225 lb (102.059 kg)    General: Appears his stated age, chronically ill appearing, somnolent today. Skin: 4 cm x 3 cm abrasion to left side of forehead. HEENT: Head: normal shape and size; Eyes: sclera injected, with thick green discharge noted, eyes dilated by optho earlier this am; Ears: Tm's gray and intact, normal light reflex; Nose: mucosa pink and moist, septum midline; Throat/Mouth: Teeth present, mucosa pink and moist, no exudate, lesions or ulcerations noted.  Neck: Neck supple, trachea midline. No massses, lumps or thyromegaly present.  Cardiovascular: Normal rate and rhythm. S1,S2 noted.  No murmur, rubs or gallops noted. No JVD or BLE edema. No carotid bruits noted. Pulmonary/Chest: Normal effort and positive vesicular breath sounds. No respiratory distress. No wheezes, rales or ronchi noted.  Musculoskeletal: Pt assessed in wheelchair. To somnolent to stand up and get on exam table.   Neurological: Somnolent, arouses to voice but falls back asleep quickly. Unable to perform complete neuro exam as pt keeps falling asleep.   BMET    Component Value Date/Time   NA 138 08/09/2012 1609   K 4.1 08/09/2012 1609   CL 103 08/09/2012 1609   CO2 31 08/09/2012 1609   GLUCOSE 89 08/09/2012 1609   BUN 12 08/09/2012 1609   CREATININE 1.1 08/09/2012 1609   CALCIUM 9.5 08/09/2012 1609   GFRNONAA 58* 07/27/2012 0517   GFRAA 67* 07/27/2012 0517    Lipid Panel     Component Value Date/Time    LDLCALC 74 03/27/2007    CBC    Component Value Date/Time   WBC 6.8 08/09/2012 1609   RBC 4.48 08/09/2012 1609   HGB 15.0 08/09/2012 1609   HCT 43.8 08/09/2012 1609   PLT 218.0 08/09/2012 1609   MCV 97.6 08/09/2012 1609   MCH 30.9 07/27/2012 0517  MCHC 34.4 08/09/2012 1609   RDW 13.1 08/09/2012 1609   LYMPHSABS 1.2 08/09/2012 1609   MONOABS 0.4 08/09/2012 1609   EOSABS 0.1 08/09/2012 1609   BASOSABS 0.0 08/09/2012 1609    Hgb A1C No results found for this basename: HGBA1C        Assessment & Plan:   AMS, confusion, weakness and multiple falls:  ? Worsening dementia Will obtain CT scan of head to r/o bleed Neurologist told wife yesterday that she needs to consider putting him in a SNF- she declines  Bilateral conjuctivitis, likely bacterial:  Seen by optho this am Already prescribed eye drops  Cough and rhinorrhea, chronic:  Exam not worrisome for bacterial infection at this time Continue to monitor for thick, discolored sputum, fever, chills  Continue Zyrtec and OTC cough suppressant for now  Will f/u after CT scan is read

## 2012-11-15 NOTE — Patient Instructions (Signed)

## 2012-11-16 ENCOUNTER — Emergency Department (HOSPITAL_COMMUNITY)
Admission: EM | Admit: 2012-11-16 | Discharge: 2012-11-16 | Disposition: A | Discharge status: Home / Self Care | Payer: Medicare HMO | Attending: Emergency Medicine | Admitting: Emergency Medicine

## 2012-11-16 ENCOUNTER — Emergency Department (HOSPITAL_COMMUNITY): Payer: Medicare HMO

## 2012-11-16 ENCOUNTER — Encounter (HOSPITAL_COMMUNITY): Payer: Self-pay | Admitting: *Deleted

## 2012-11-16 ENCOUNTER — Telehealth: Payer: Self-pay | Admitting: Family Medicine

## 2012-11-16 DIAGNOSIS — Z8739 Personal history of other diseases of the musculoskeletal system and connective tissue: Secondary | ICD-10-CM | POA: Insufficient documentation

## 2012-11-16 DIAGNOSIS — Z951 Presence of aortocoronary bypass graft: Secondary | ICD-10-CM | POA: Insufficient documentation

## 2012-11-16 DIAGNOSIS — Z87442 Personal history of urinary calculi: Secondary | ICD-10-CM | POA: Insufficient documentation

## 2012-11-16 DIAGNOSIS — N390 Urinary tract infection, site not specified: Secondary | ICD-10-CM | POA: Insufficient documentation

## 2012-11-16 DIAGNOSIS — I10 Essential (primary) hypertension: Secondary | ICD-10-CM | POA: Insufficient documentation

## 2012-11-16 DIAGNOSIS — R1110 Vomiting, unspecified: Secondary | ICD-10-CM

## 2012-11-16 DIAGNOSIS — Z79899 Other long term (current) drug therapy: Secondary | ICD-10-CM | POA: Insufficient documentation

## 2012-11-16 DIAGNOSIS — I2510 Atherosclerotic heart disease of native coronary artery without angina pectoris: Secondary | ICD-10-CM | POA: Insufficient documentation

## 2012-11-16 DIAGNOSIS — F0390 Unspecified dementia without behavioral disturbance: Secondary | ICD-10-CM | POA: Insufficient documentation

## 2012-11-16 DIAGNOSIS — Z950 Presence of cardiac pacemaker: Secondary | ICD-10-CM | POA: Insufficient documentation

## 2012-11-16 DIAGNOSIS — E785 Hyperlipidemia, unspecified: Secondary | ICD-10-CM | POA: Insufficient documentation

## 2012-11-16 DIAGNOSIS — Z8673 Personal history of transient ischemic attack (TIA), and cerebral infarction without residual deficits: Secondary | ICD-10-CM | POA: Insufficient documentation

## 2012-11-16 LAB — URINE MICROSCOPIC-ADD ON

## 2012-11-16 LAB — URINALYSIS, ROUTINE W REFLEX MICROSCOPIC
Hgb urine dipstick: NEGATIVE
Nitrite: NEGATIVE
Protein, ur: NEGATIVE mg/dL
Specific Gravity, Urine: 1.032 — ABNORMAL HIGH (ref 1.005–1.030)
Urobilinogen, UA: 0.2 mg/dL (ref 0.0–1.0)

## 2012-11-16 LAB — CBC WITH DIFFERENTIAL/PLATELET
Basophils Absolute: 0 10*3/uL (ref 0.0–0.1)
Eosinophils Absolute: 0 10*3/uL (ref 0.0–0.7)
Eosinophils Relative: 0 % (ref 0–5)
Lymphocytes Relative: 6 % — ABNORMAL LOW (ref 12–46)
MCV: 90.8 fL (ref 78.0–100.0)
Neutrophils Relative %: 89 % — ABNORMAL HIGH (ref 43–77)
Platelets: 202 10*3/uL (ref 150–400)
RBC: 4.58 MIL/uL (ref 4.22–5.81)
RDW: 13.5 % (ref 11.5–15.5)
WBC: 9.4 10*3/uL (ref 4.0–10.5)

## 2012-11-16 LAB — COMPREHENSIVE METABOLIC PANEL
AST: 27 U/L (ref 0–37)
BUN: 13 mg/dL (ref 6–23)
CO2: 24 mEq/L (ref 19–32)
Calcium: 9.4 mg/dL (ref 8.4–10.5)
Chloride: 102 mEq/L (ref 96–112)
Creatinine, Ser: 0.99 mg/dL (ref 0.50–1.35)
GFR calc Af Amer: 89 mL/min — ABNORMAL LOW (ref >90)
GFR calc non Af Amer: 76 mL/min — ABNORMAL LOW (ref >90)
Glucose, Bld: 105 mg/dL — ABNORMAL HIGH (ref 70–99)
Total Bilirubin: 1.4 mg/dL — ABNORMAL HIGH (ref 0.3–1.2)

## 2012-11-16 LAB — TROPONIN I: Troponin I: <0.3 ng/mL (ref <0.30)

## 2012-11-16 MED ORDER — MEMANTINE HCL 10 MG PO TABS: 10.0000 mg | ORAL_TABLET | Freq: Two times a day (BID) | ORAL | Status: DC

## 2012-11-16 MED ORDER — CEPHALEXIN 500 MG PO CAPS: 500.0000 mg | ORAL_CAPSULE | Freq: Two times a day (BID) | ORAL | Status: DC

## 2012-11-16 MED ORDER — SODIUM CHLORIDE 0.9 % IV SOLN
Freq: Once | INTRAVENOUS | Status: AC
  Administered 2012-11-16: 20:00:00 via INTRAVENOUS

## 2012-11-16 MED ORDER — ONDANSETRON HCL 4 MG/2ML IJ SOLN
4.0000 mg | Freq: Once | INTRAMUSCULAR | Status: AC
Start: 2012-11-16 — End: 2012-11-16
  Administered 2012-11-16: 4 mg via INTRAVENOUS
  Filled 2012-11-16: qty 2

## 2012-11-16 NOTE — ED Notes (Signed)
Bed: WA09 Expected date:  Expected time:  Means of arrival:  Comments: vomiting

## 2012-11-16 NOTE — ED Provider Notes (Signed)
CSN: [id number]     Arrival date & time 11/16/12  1831 History   First MD Initiated Contact with Patient 11/16/12 1840     Chief Complaint  Patient presents with  . Emesis   (Consider location/radiation/quality/duration/timing/severity/associated sxs/prior Treatment) HPI Comments: Patient is a 58 male past medical history significant for hypertension, hyperlipidemia, history of CVA, Alzheimer's dementia presented to the emergency department for Monday of nonbloody emesis. The patient's wife states that the patient had 2-3 follow Monday and Tuesday prior to the onset of emesis. They were seen by the primary care doctor and chem yesterday where a CT scan of his head was done with negative results. Patient was diagnosed with conjunctivitis and started on antibiotic drops yesterday. Patient has no complaints at this time. The wife endorses the patient is at baseline. Patient is a level V caveat due to dementia.  Patient is a 79 y.o. male presenting with vomiting.  Emesis   Past Medical History  Diagnosis Date  . Hypertension   . Hyperlipidemia   . Renal stones     stones  . Stroke   . CAD (coronary artery disease)   . Dementia     after CVA  . Foot drop, left   . Dementia   . Complication of anesthesia     trouble waking up in childhood   Past Surgical History  Procedure Laterality Date  . Appendectomy    . Tonsillectomy and adenoidectomy  1960's  . 3 back surgeries  1980's  . Pacemaker insertion  2000  . 5 bypass  04/29/2005  . Stroke during bypass surgery 04/29/2005 and another 05/17/05  2007  . Total knee replacement, left  10/07  . Total knee replacement, right  2/09  . 2nd pacemaker, generator (battery) replaced  01/08/11   Family History  Problem Relation Age of Onset  . Cancer Brother     stomach  . Heart disease Other   . Heart disease Mother   . Heart disease Father   . Colon cancer Neg Hx   . Prostate cancer Neg Hx    History  Substance Use Topics  . Smoking  status: Never Smoker   . Smokeless tobacco: Never Used  . Alcohol Use: No    Review of Systems  Unable to perform ROS: Dementia  Gastrointestinal: Positive for vomiting.    Allergies  Review of patient's allergies indicates no known allergies.  Home Medications   Current Outpatient Rx  Name  Route  Sig  Dispense  Refill  . calcium carbonate (OS-CAL) 600 MG TABS   Oral   Take 600 mg by mouth 2 (two) times daily with a meal.         . Cetirizine HCl (ZYRTEC ALLERGY) 10 MG CAPS   Oral   Take 10 mg by mouth daily.         . citalopram (CELEXA) 20 MG tablet   Oral   Take 20 mg by mouth every morning.           . clopidogrel (PLAVIX) 75 MG tablet   Oral   Take 75 mg by mouth at bedtime.          . donepezil (ARICEPT) 23 MG TABS tablet   Oral   Take 1 tablet (23 mg total) by mouth at bedtime.   30 tablet      . Glucosamine-Chondroitin (OSTEO BI-FLEX REGULAR STRENGTH) 250-200 MG TABS   Oral   Take 1 tablet by mouth every evening.         Marland Kitchen  l-methylfolate-B6-B12 (METANX) 3-35-2 MG TABS   Oral   Take 1 tablet by mouth every morning.           . metoprolol tartrate (LOPRESSOR) 25 MG tablet   Oral   Take 12.5 mg by mouth 2 (two) times daily.          Marland Kitchen MICRONIZED COLESTIPOL HCL 1 G tablet   Oral   Take 1 g by mouth every evening. 1-2 daily         . simvastatin (ZOCOR) 40 MG tablet   Oral   Take 40 mg by mouth every evening.         . tobramycin-dexamethasone (TOBRADEX) ophthalmic solution   Both Eyes   Place 1 drop into both eyes every 6 (six) hours.          . cephALEXin (KEFLEX) 500 MG capsule   Oral   Take 1 capsule (500 mg total) by mouth 2 (two) times daily.   20 capsule   0   . memantine (NAMENDA) 10 MG tablet   Oral   Take 1 tablet (10 mg total) by mouth 2 (two) times daily.   180 tablet   1     Please Fill Namenda 10mg  BID while Namenda XR is o ...    BP 112/52  Pulse 73  Temp(Src) 97.4 F (36.3 C) (Oral)  Resp 16   SpO2 98% Physical Exam  Constitutional: He appears well-developed and well-nourished. No distress.  HENT:  Head: Normocephalic. Head is with abrasion.    Right Ear: External ear normal.  Left Ear: External ear normal.  Nose: Nose normal.  Eyes: Conjunctivae and EOM are normal. Pupils are equal, round, and reactive to light.  Neck: Normal range of motion. Neck supple.  Pulmonary/Chest: Effort normal and breath sounds normal.  Abdominal: Soft. Bowel sounds are normal. There is no tenderness.  Neurological: He is alert. He has normal strength.  Oriented to self.   Skin: Skin is warm, dry and intact. Bruising noted. No rash noted. He is not diaphoretic.       ED Course  Procedures (including critical care time) Labs Review Labs Reviewed  COMPREHENSIVE METABOLIC PANEL - Abnormal; Notable for the following:    Glucose, Bld 105 (*)    Total Bilirubin 1.4 (*)    GFR calc non Af Amer 76 (*)    GFR calc Af Amer 89 (*)    All other components within normal limits  URINALYSIS, ROUTINE W REFLEX MICROSCOPIC - Abnormal; Notable for the following:    Color, Urine AMBER (*)    APPearance TURBID (*)    Specific Gravity, Urine 1.032 (*)    Bilirubin Urine SMALL (*)    Ketones, ur 15 (*)    Leukocytes, UA MODERATE (*)    All other components within normal limits  CBC WITH DIFFERENTIAL - Abnormal; Notable for the following:    Neutrophils Relative % 89 (*)    Neutro Abs 8.4 (*)    Lymphocytes Relative 6 (*)    Lymphs Abs 0.6 (*)    All other components within normal limits  URINE MICROSCOPIC-ADD ON - Abnormal; Notable for the following:    Bacteria, UA FEW (*)    Casts HYALINE CASTS (*)    All other components within normal limits  URINE CULTURE  TROPONIN I   Imaging Review Dg Chest 2 View  11/16/2012   CLINICAL DATA:  Cough. History of hypertension.  EXAM: CHEST  2 VIEW  COMPARISON:  CHEST x-ray 07/26/2012.  FINDINGS: Lung volumes are normal. No consolidative airspace disease. No  pleural effusions. No pneumothorax. No pulmonary nodule or mass noted. Pulmonary vasculature and the cardiomediastinal silhouette are within normal limits. Atherosclerosis in the thoracic aorta. Status post median sternotomy for CABG. Right-sided pacemaker device in place with lead tips in the expected location of the right atrium and right ventricular apex.  IMPRESSION: 1. No radiographic evidence of acute cardiopulmonary disease. 2. Atherosclerosis. 3. Postoperative changes and support apparatus, as above.   Electronically Signed   By: Trudie Reed M.D.   On: 11/16/2012 20:03   Ct Head Wo Contrast  11/15/2012   CLINICAL DATA:  Altered mental status and confusion ; multiple recent falls  EXAM: CT HEAD WITHOUT CONTRAST  TECHNIQUE: Contiguous axial images were obtained from the base of the skull through the vertex without intravenous contrast.  COMPARISON:  Jul 26, 2012  FINDINGS: Moderate diffuse atrophy is stable. There is no mass, hemorrhage, extra-axial fluid collection, or midline shift. There is small vessel disease throughout the centra semiovale bilaterally, stable. No new gray-white compartment lesion is identified. There is no demonstrable acute infarct.  Bony calvarium appears intact. The mastoid air cells are clear. There is mucosal thickening in the lateral maxillary antrum on the right.  IMPRESSION: Atrophy with small vessel disease, stable. There is no mass, hemorrhage, or acute appearing infarct.  There is stable maxillary sinus disease on the right laterally.   Electronically Signed   By: Bretta Bang   On: 11/15/2012 15:23    Date: 11/16/2012  Rate: 75  Rhythm: AV dual paced  QRS Axis: indeterminate  Intervals: Paced  ST/T Wave abnormalities: indeterminate  Conduction Disutrbances:Paced  Narrative Interpretation:   Old EKG Reviewed: unchanged   MDM   1. Emesis   2. UTI (urinary tract infection)    Afebrile, NAD, non-toxic appearing, AAO to self. Patient at baseline per  wife, sister, daughter. No neurological deficits on examination. Patient able to follow commands appropriately. Abdomen soft, nontender, nondistended with normal bowel sounds. No guarding or rigidity appreciated. Nonsurgical abdomen. CT scan obtained at PCP office yesterday with history of fall. No acute findings. Chest x-ray without acute cardiopulmonary disease. EKG without acute abnormality. Urine revealed few bacteria patient will be treated with Keflex. I have reviewed nursing notes, vital signs, and all appropriate lab and imaging results for this patient. Patient without emesis during entire ED stay. Return precautions discussed. Advised PCP followup. Patient and family are able to plan. Discussed patient care with Dr. Fayrene Fearing who agrees with my plan. Patient stable at time of discharge.      Lise Auer Jowanna Loeffler, PA-C 11/16/12 2358  Lise Auer Nieko Clarin, PA-C 11/17/12 0000

## 2012-11-16 NOTE — Telephone Encounter (Signed)
Pt's wife called and said that pt is "throwing up yellow" and that Nicki Reaper told her that if that happened she should take him to the ED.  Spoke with PCP, Dr. Para March, who confirmed if pt is vomiting, he should be seen in ED.  Relayed message to pt's wife who asked me to call [facility] ED to tell them she was on the way with pt.  I informed her that they would have to see him before they put him on a list in the ED and she replied "whatever" and hung up.

## 2012-11-16 NOTE — ED Notes (Signed)
EMS called by patients daughter states he has been vomiting for 2 days. Patient has a hx of dementia and is denies any other symptoms at this time.

## 2012-11-16 NOTE — ED Provider Notes (Signed)
EKG: Indication for nausea. Interpretation paced rhythm. AV dual paced. No morphology changes versus comparison.  Roney Marion, MD 11/16/12 [phone]

## 2012-11-16 NOTE — Telephone Encounter (Signed)
Namenda XR is currently on back order.  Updated Rx to Namenda 10 mg BID to be filled until XR form becomes available.  I called. Spoke with spouse.  She is aware of the change.

## 2012-11-18 LAB — URINE CULTURE: Colony Count: NO GROWTH

## 2012-11-19 ENCOUNTER — Other Ambulatory Visit: Payer: Self-pay | Admitting: Neurology

## 2012-11-19 NOTE — ED Provider Notes (Signed)
I saw and examined this patient. Contusion of the forehead. No other signs suggestive of skull fracture. No blood over the TMs. No blood over the mastoids. Wife continues to state that patient is at his baseline. He is status post 2 ground-level falls in the home this week. He had a normal CT scan with his primary care physician. He is not having syncope he is not having seizure. The studies here are reassuring. He does have urinary tract infection. I think is appropriate for outpatient treatment continued follow up with his primary care  Medical screening examination/treatment/procedure(s) were conducted as a shared visit with non-physician practitioner(s) and myself.  I personally evaluated the patient during the encounter  Roney Marion, MD 11/19/12 [phone]

## 2012-12-23 ENCOUNTER — Other Ambulatory Visit: Payer: Self-pay | Admitting: Neurology

## 2013-02-04 ENCOUNTER — Other Ambulatory Visit: Payer: Self-pay

## 2013-02-04 MED ORDER — DONEPEZIL HCL 23 MG PO TABS: 23.0000 mg | ORAL_TABLET | Freq: Every day | ORAL | Status: DC

## 2013-03-26 ENCOUNTER — Telehealth: Payer: Self-pay | Admitting: Nurse Practitioner

## 2013-04-19 ENCOUNTER — Telehealth: Payer: Self-pay | Admitting: Family Medicine

## 2013-04-19 NOTE — Telephone Encounter (Signed)
Form placed in your In Box.

## 2013-04-19 NOTE — Telephone Encounter (Signed)
Pt's sister-in-law dropped off FL2 form to be filled out for pt to get admitted to nursing home. Pt's form given to [place]. Please call when form is ready to be picked up.  Thank you!

## 2013-04-19 NOTE — Telephone Encounter (Signed)
I need some extra information. Please verify  1. His med list 2. His general level of disorientation, confusion 3. If he have been wandering or abusive 4. Bowel and bladder continence 5. His need for help with bathing/feeding/dressing 6. His ability or lack thereof to express any needs verbally  Thanks.

## 2013-04-20 NOTE — Telephone Encounter (Signed)
1. Meds list in your In Box. 2. Extremely confused and disoriented. Level 9 of 10. 3. Wanders around the house but not outside and not abusive 4. No bowel or bladder incontinence. 5. Needs help bathing and dressing or undressing but is able to feed himself and he eats well. 6. Has trouble expressing himself verbally.

## 2013-04-22 NOTE — Telephone Encounter (Signed)
Form done. Thanks.   Please attach med list and give to patient's family.

## 2013-04-23 ENCOUNTER — Ambulatory Visit (INDEPENDENT_AMBULATORY_CARE_PROVIDER_SITE_OTHER): Payer: Medicare HMO | Admitting: *Deleted

## 2013-04-23 ENCOUNTER — Encounter: Payer: Self-pay | Admitting: Family Medicine

## 2013-04-23 DIAGNOSIS — Z09 Encounter for follow-up examination after completed treatment for conditions other than malignant neoplasm: Secondary | ICD-10-CM

## 2013-04-23 DIAGNOSIS — Z111 Encounter for screening for respiratory tuberculosis: Secondary | ICD-10-CM

## 2013-04-23 NOTE — Telephone Encounter (Signed)
Wife advised.  PPW left at front desk for pick up.

## 2013-04-25 ENCOUNTER — Telehealth: Payer: Self-pay

## 2013-04-25 LAB — TB SKIN TEST
Induration: 0.5 mm
TB Skin Test: NEGATIVE

## 2013-04-25 NOTE — Telephone Encounter (Signed)
Pt came for reading of PPD; negative.

## 2013-05-02 ENCOUNTER — Encounter: Payer: Self-pay | Admitting: *Deleted

## 2013-05-15 ENCOUNTER — Ambulatory Visit (INDEPENDENT_AMBULATORY_CARE_PROVIDER_SITE_OTHER): Payer: Medicare HMO | Admitting: Nurse Practitioner

## 2013-05-15 ENCOUNTER — Encounter (INDEPENDENT_AMBULATORY_CARE_PROVIDER_SITE_OTHER): Payer: Self-pay

## 2013-05-15 ENCOUNTER — Encounter: Payer: Self-pay | Admitting: Nurse Practitioner

## 2013-05-15 VITALS — BP 106/67 | HR 74 | Ht 72.0 in | Wt 200.0 lb

## 2013-05-15 DIAGNOSIS — I6789 Other cerebrovascular disease: Secondary | ICD-10-CM

## 2013-05-15 DIAGNOSIS — F0390 Unspecified dementia without behavioral disturbance: Secondary | ICD-10-CM

## 2013-05-15 DIAGNOSIS — F0150 Vascular dementia without behavioral disturbance: Secondary | ICD-10-CM

## 2013-05-15 MED ORDER — CITALOPRAM HYDROBROMIDE 20 MG PO TABS: 20.0000 mg | ORAL_TABLET | Freq: Every day | ORAL | Status: DC

## 2013-05-15 MED ORDER — DONEPEZIL HCL 23 MG PO TABS: 23.0000 mg | ORAL_TABLET | Freq: Every day | ORAL | Status: DC

## 2013-05-15 MED ORDER — CLOPIDOGREL BISULFATE 75 MG PO TABS: 75.0000 mg | ORAL_TABLET | Freq: Every day | ORAL | Status: DC

## 2013-05-15 NOTE — Progress Notes (Signed)
[facility]  PATIENT: Wenda Lowennis A Mccorvey DOB: 1931-03-23   REASON FOR VISIT: Followup for dementia and history of stroke   HISTORY OF PRESENT ILLNESS:Mr. Kleckner, 80 year old male returns for followup with his wife.  He has a history of vascular dementia stroke, following cardiac surgery in 2007 and he  has continued to progressively decline. His orthostatic dizziness and balance difficulties likely multifactorial, he continues to fall on occasion. Wife is considering placement now as he is more difficult to take care of and she has little help from her children. He requires assistance with bathing and dressing, toileting et Karie Sodacetera he is ambulating with a 4 prong cane today he remains on Plavix with minimal bruising, he remains on Aricept for his memory loss. He returns for reevaluation   HISTORY: of Memory loss and vascular dementia since stroke following cardiac surgery in 2007 which has slowly declined. MMSE 11/30 today. Orthostatic dizziness and balance difficulties likely multifactorial, he continues to fall on occasion. He is accompanied by his wife and son. Patient has had continued to decline over the last 6 months, having more hallucinations which are more bothersome to the wife then they are to the patient . He needs more assistance with activities of daily living like, he can no longer shower alone, dress, or toilet himself without assistance. He is currently on Aricept 23 mg without significant GI side effects. He is on Namenda XR 28 mg daily. He gets lost easily has trouble finding anything in the house and short-term memory remains quite poor. He is on Citalopram for his anxiety/depression. He is using a 4 point cane today.On Plavix from Aggren[employer] because insurance will no longer pay unless he fails Plavix. Wife currently does not have any in-home assistance. She does not want him placed  REVIEW OF SYSTEMS: Full 14 system review of systems performed and notable only  for those listed, all others are neg:  Constitutional: N/A  Cardiovascular: N/A  Ear/Nose/Throat: N/A  Skin: N/A  Eyes: N/A  Respiratory: Cough  Gastroitestinal: N/A  Hematology/Lymphatic: N/A  Endocrine: N/A Musculoskeletal:N/A  Allergy/Immunology: N/A  Neurological: Memory loss, Psychiatric: Confusion agitation, anxiety   ALLERGIES: No Known Allergies  HOME MEDICATIONS: Outpatient Prescriptions Prior to Visit  Medication Sig Dispense Refill  . calcium carbonate (OS-CAL) 600 MG TABS Take 600 mg by mouth 2 (two) times daily with a meal.      . Cetirizine HCl (ZYRTEC ALLERGY) 10 MG CAPS Take 10 mg by mouth daily as needed.       . citalopram (CELEXA) 20 MG tablet TAKE 1 TABLET BY MOUTH EVERY DAY  30 tablet  6  . clopidogrel (PLAVIX) 75 MG tablet TAKE 1 TABLET EVERY DAY  90 tablet  1  . donepezil (ARICEPT) 23 MG TABS tablet Take 1 tablet (23 mg total) by mouth daily.  30 tablet  3  . l-methylfolate-B6-B12 (METANX) 3-35-2 MG TABS Take 1 tablet by mouth every morning.        . metoprolol tartrate (LOPRESSOR) 25 MG tablet Take 12.5 mg by mouth 2 (two) times daily.       . simvastatin (ZOCOR) 40 MG tablet Take 40 mg by mouth every evening.       No facility-administered medications prior to visit.    PAST MEDICAL HISTORY: Past Medical History  Diagnosis Date  . Hypertension   . Hyperlipidemia   . Renal stones     stones  . Stroke   . CAD (coronary artery disease)   .  Dementia     after CVA  . Foot drop, left   . Dementia   . Complication of anesthesia     trouble waking up in childhood    PAST SURGICAL HISTORY: Past Surgical History  Procedure Laterality Date  . Appendectomy    . Tonsillectomy and adenoidectomy  1960's  . 3 back surgeries  1980's  . Pacemaker insertion  2000  . 5 bypass  04/29/2005  . Stroke during bypass surgery 04/29/2005 and another 05/17/05  2007  . Total knee replacement, left  10/07  . Total knee replacement, right  2/09  . 2nd pacemaker,  generator (battery) replaced  01/08/11    FAMILY HISTORY: Family History  Problem Relation Age of Onset  . Cancer Brother     stomach  . Heart disease Other   . Heart disease Mother   . Heart disease Father   . Colon cancer Neg Hx   . Prostate cancer Neg Hx     SOCIAL HISTORY: History   Social History  . Marital Status: Married    Spouse Name: N/A    Number of Children: N/A  . Years of Education: N/A   Occupational History  . Retired Radio broadcast assistant    Social History Main Topics  . Smoking status: Never Smoker   . Smokeless tobacco: Never Used  . Alcohol Use: No  . Drug Use: No  . Sexual Activity: Not on file   Other Topics Concern  . Not on file   Social History Narrative   Education:  12th grade   Married 1957   Retired Surveyor, minerals   From [employer]     PHYSICAL EXAM  Filed Vitals:   05/15/13 1349  BP: 106/67  Pulse: 74  Height: 6' (1.829 m)  Weight: 200 lb (90.719 kg)   Body mass index is 27.12 kg/(m^2).  Generalized: Well developed, in no acute distress  Head: normocephalic and atraumatic,. Oropharynx benign  Neck: Supple, no carotid bruits  Cardiac: Regular rate rhythm, no murmur  Musculoskeletal: No deformity   Neurological examination   Mentation: Alert, MMSE 12/30. AFT 0. Follows some  Commands, word finding difficulty  Cranial nerve II-XII: Pupils were equal round reactive to light extraocular movements were full, unable to perform visual field testing . Facial sensation and strength were normal. hearing was intact to finger rubbing bilaterally. Uvula tongue midline. head turning and shoulder shrug were normal and symmetric.Tongue protrusion into cheek strength was normal. Motor: normal bulk and tone, full strength in the BUE, BLE,  No focal weakness, mild outstretched tremor Coordination: Unable to perform Reflexes: Symmetric upper and lower but depressed Gait and Station: Rising up from seated position with assistance, wide based  stance,  ataxic gait, unsteady with turns  DIAGNOSTIC DATA (LABS, IMAGING, TESTING) - I reviewed patient records, labs, notes, testing and imaging myself where available.  Lab Results  Component Value Date   WBC 9.4 11/16/2012   HGB 14.4 11/16/2012   HCT 41.6 11/16/2012   MCV 90.8 11/16/2012   PLT 202 11/16/2012     ASSESSMENT AND PLAN  80 y.o. year old male  has a past medical history of Hypertension; Hyperlipidemia;  Stroke; CAD (coronary artery disease); Dementia;here to followup.  Continue Aricept will renew Continue Plavix will renew Continue Celexa will renew Followup 6 months to one year Nilda Riggs, [facility], [facility], APRN  [facility] [address] [place], [place] [address] [phone]

## 2013-05-15 NOTE — Patient Instructions (Signed)
Continue Aricept will renew Continue Plavix will renew Followup 6 months to one year

## 2013-05-21 ENCOUNTER — Other Ambulatory Visit: Payer: Self-pay

## 2013-05-21 MED ORDER — METOPROLOL TARTRATE 25 MG PO TABS: 12.5000 mg | ORAL_TABLET | Freq: Two times a day (BID) | ORAL | Status: DC

## 2013-05-21 NOTE — Telephone Encounter (Signed)
Rx was sent to pharmacy electronically. 

## 2013-05-23 ENCOUNTER — Other Ambulatory Visit: Payer: Self-pay | Admitting: *Deleted

## 2013-05-24 ENCOUNTER — Other Ambulatory Visit: Payer: Self-pay | Admitting: *Deleted

## 2013-05-29 ENCOUNTER — Encounter: Payer: Self-pay | Admitting: Cardiovascular Disease

## 2013-05-29 ENCOUNTER — Ambulatory Visit (INDEPENDENT_AMBULATORY_CARE_PROVIDER_SITE_OTHER): Payer: Medicare HMO | Admitting: Cardiovascular Disease

## 2013-05-29 VITALS — BP 100/60 | HR 74 | Resp 16 | Ht 73.0 in | Wt 201.0 lb

## 2013-05-29 DIAGNOSIS — Z79899 Other long term (current) drug therapy: Secondary | ICD-10-CM

## 2013-05-29 DIAGNOSIS — I442 Atrioventricular block, complete: Secondary | ICD-10-CM

## 2013-05-29 DIAGNOSIS — I2510 Atherosclerotic heart disease of native coronary artery without angina pectoris: Secondary | ICD-10-CM

## 2013-05-29 DIAGNOSIS — R296 Repeated falls: Secondary | ICD-10-CM

## 2013-05-29 DIAGNOSIS — Z950 Presence of cardiac pacemaker: Secondary | ICD-10-CM

## 2013-05-29 DIAGNOSIS — Z9181 History of falling: Secondary | ICD-10-CM

## 2013-05-29 DIAGNOSIS — E782 Mixed hyperlipidemia: Secondary | ICD-10-CM

## 2013-05-29 LAB — MDC_IDC_ENUM_SESS_TYPE_INCLINIC
Battery Impedance: 206 Ohm
Brady Statistic AP VP Percent: 93.9 %
Brady Statistic AP VS Percent: 0.1 % — CL
Brady Statistic AS VP Percent: 6.1 %
Brady Statistic AS VS Percent: 0.1 % — CL
Lead Channel Pacing Threshold Amplitude: 0.5 V
Lead Channel Pacing Threshold Amplitude: 1 V
Lead Channel Sensing Intrinsic Amplitude: 4 mV
Lead Channel Setting Pacing Pulse Width: 0.64 ms
MDC IDC MSMT BATTERY VOLTAGE: 2.77 V
MDC IDC MSMT LEADCHNL RA IMPEDANCE VALUE: 430 Ohm
MDC IDC MSMT LEADCHNL RA PACING THRESHOLD PULSEWIDTH: 0.4 ms
MDC IDC MSMT LEADCHNL RV IMPEDANCE VALUE: 625 Ohm
MDC IDC MSMT LEADCHNL RV PACING THRESHOLD PULSEWIDTH: 0.64 ms
MDC IDC SET LEADCHNL RA PACING AMPLITUDE: 1.5 V
MDC IDC SET LEADCHNL RV PACING AMPLITUDE: 2.5 V
MDC IDC SET LEADCHNL RV SENSING SENSITIVITY: 2.8 mV

## 2013-05-29 LAB — PACEMAKER DEVICE OBSERVATION

## 2013-05-29 MED ORDER — ASPIRIN EC 81 MG PO TBEC: 81.0000 mg | DELAYED_RELEASE_TABLET | Freq: Every day | ORAL | Status: DC

## 2013-05-29 NOTE — Patient Instructions (Signed)
Your physician recommends that you return for lab work [facility].  STOP Metoprolol.  START Aspirin 81mg  daily.  Remote monitoring is used to monitor your Pacemaker of ICD from home. This monitoring reduces the number of office visits required to check your device to one time per year. It allows us to keep an eye on the functioning of your device to ensure it is working properly. You are scheduled for a device check from home on August 29, 2013. You may send your transmission at any time that day. If you have a wireless device, the transmission will be sent automatically. After your physician reviews your transmission, you will receive a postcard with your next transmission date.  Your physician recommends that you schedule a follow-up appointment in: One year.

## 2013-05-30 ENCOUNTER — Encounter: Payer: Self-pay | Admitting: Cardiovascular Disease

## 2013-05-30 DIAGNOSIS — R296 Repeated falls: Secondary | ICD-10-CM | POA: Insufficient documentation

## 2013-05-30 DIAGNOSIS — I442 Atrioventricular block, complete: Secondary | ICD-10-CM | POA: Insufficient documentation

## 2013-05-30 DIAGNOSIS — Z950 Presence of cardiac pacemaker: Secondary | ICD-10-CM | POA: Insufficient documentation

## 2013-05-30 NOTE — Assessment & Plan Note (Signed)
Pacemaker dependent 

## 2013-05-30 NOTE — Assessment & Plan Note (Signed)
Normal device function. No changes in  pacemaker settings at this visit.  They are compliant with remote pacemaker downloads peer

## 2013-05-30 NOTE — Assessment & Plan Note (Signed)
Excellent lipid profile.

## 2013-05-30 NOTE — Assessment & Plan Note (Signed)
Because of [name]'s dementia it is hard to obtain a detailed history about the causes of the falls. I worry that he has orthostatic hypotension. Have recommended that he discontinue his metoprolol altogether. I've recommended that [name] discuss whether or not citalopram if still indicated with his primary care physician.

## 2013-05-30 NOTE — Progress Notes (Signed)
Patient ID: Kurt [name], male   DOB: February 27, 1932, 80 y.o.   MRN: [id number]     Reason for office visit Pacemaker followup, CAD, hyperlipidemia  Kurt [name] is almost 80 years old. He underwent bypass surgery in 2007 and received a permanent pacemaker in 2004. He had a generator change at most recently in 2012 (dual chamber Medtronic adapta). He now has complete heart block and is pacemaker dependent. He has moderately advanced dementia and no longer recognizes his son and grandson. His wife takes care of him.  He has no complaints and specifically denies dyspnea edema and angina. Has occasional dizziness and has had a few falls. These appear to be secondary to balance issues and both Mrs. Rockwell and his wife deny loss of consciousness.  Interrogation of his pacemaker shows normal device function. A couple of episodes of  very brief nonsustained ventricular tachycardia (lasting 4 and 5 seconds respectively, maximum heart rate of 179 beats per minute) have been recorded, neither one of them correlated with his falls. Device longevity is estimated at another 8 years. There is 94% atrial pacing and 100% ventricular pacing. The heart rate histogram distribution is very blunted but he is also very sedentary.  His electrocardiogram is nondiagnostic due to ventricular pacing.   No Known Allergies  Current Outpatient Prescriptions  Medication Sig Dispense Refill  . calcium carbonate (OS-CAL) 600 MG TABS Take 600 mg by mouth 2 (two) times daily with a meal.      . Cetirizine HCl (ZYRTEC ALLERGY) 10 MG CAPS Take 10 mg by mouth daily as needed.       . citalopram (CELEXA) 20 MG tablet Take 1 tablet (20 mg total) by mouth daily.  30 tablet  6  . clopidogrel (PLAVIX) 75 MG tablet Take 1 tablet (75 mg total) by mouth daily with breakfast.  30 tablet  6  . donepezil (ARICEPT) 23 MG TABS tablet Take 1 tablet (23 mg total) by mouth daily.  30 tablet  6  . l-methylfolate-B6-B12 (METANX) 3-35-2 MG TABS  Take 1 tablet by mouth every morning.        . simvastatin (ZOCOR) 40 MG tablet Take 40 mg by mouth every evening.      Marland Kitchen aspirin EC 81 MG tablet Take 1 tablet (81 mg total) by mouth daily.  30 tablet  0   No current facility-administered medications for this visit.    Past Medical History  Diagnosis Date  . Hypertension   . Hyperlipidemia   . Renal stones     stones  . Stroke   . CAD (coronary artery disease)   . Dementia     after CVA  . Foot drop, left   . Dementia   . Complication of anesthesia     trouble waking up in childhood    Past Surgical History  Procedure Laterality Date  . Appendectomy    . Tonsillectomy and adenoidectomy  1960's  . 3 back surgeries  1980's  . Pacemaker insertion  2000  . 5 bypass  04/29/2005  . Stroke during bypass surgery 04/29/2005 and another 05/17/05  2007  . Total knee replacement, left  10/07  . Total knee replacement, right  2/09  . 2nd pacemaker, generator (battery) replaced  01/08/11    Family History  Problem Relation Age of Onset  . Cancer Brother     stomach  . Heart disease Other   . Heart disease Mother   . Heart disease Father   .  Colon cancer Neg Hx   . Prostate cancer Neg Hx     History   Social History  . Marital Status: Married    Spouse Name: N/A    Number of Children: N/A  . Years of Education: N/A   Occupational History  . Retired Radio broadcast assistant    Social History Main Topics  . Smoking status: Never Smoker   . Smokeless tobacco: Never Used  . Alcohol Use: No  . Drug Use: No  . Sexual Activity: Not on file   Other Topics Concern  . Not on file   Social History Narrative   Education:  12th grade   Married 1957   Retired Surveyor, minerals   From [employer]    Review of systems: Static dizziness, falls The patient specifically denies any chest pain at rest or with exertion, dyspnea at rest or with exertion, orthopnea, paroxysmal nocturnal dyspnea, syncope, palpitations, focal neurological  deficits, intermittent claudication, lower extremity edema, unexplained weight gain, cough, hemoptysis or wheezing.  The patient also denies abdominal pain, nausea, vomiting, dysphagia, diarrhea, constipation, polyuria, polydipsia, dysuria, hematuria, frequency, urgency, abnormal bleeding or bruising, fever, chills, unexpected weight changes, mood swings, change in skin or hair texture, change in voice quality, auditory or visual problems, allergic reactions or rashes, new musculoskeletal complaints other than usual "aches and pains".   PHYSICAL EXAM BP 100/60  Pulse 74  Resp 16  Ht 6\' 1"  (1.854 m)  Wt 91.173 kg (201 lb)  BMI 26.52 kg/m2  General: Alert, oriented x3, no distress Head: no evidence of trauma, PERRL, EOMI, no exophtalmos or lid lag, no myxedema, no xanthelasma; normal ears, nose and oropharynx Neck: normal jugular venous pulsations and no hepatojugular reflux; brisk carotid pulses without delay and no carotid bruits Chest: clear to auscultation, no signs of consolidation by percussion or palpation, normal fremitus, symmetrical and full respiratory excursions; healthy left subclavian pacemaker site Cardiovascular: normal position and quality of the apical impulse, regular rhythm, normal first and paradoxically split second heart sounds, no murmurs, rubs or gallops Abdomen: no tenderness or distention, no masses by palpation, no abnormal pulsatility or arterial bruits, normal bowel sounds, no hepatosplenomegaly Extremities: no clubbing, cyanosis or edema; 2+ radial, ulnar and brachial pulses bilaterally; 2+ right femoral, posterior tibial and dorsalis pedis pulses; 2+ left femoral, posterior tibial and dorsalis pedis pulses; no subclavian or femoral bruits Neurological: grossly nonfocal   EKG: 100% atrial paced ventricular paced  Lipid Panel 2014 total cholesterol 135, triglycerides 80, HDL 49, LDL 68  BMET    Component Value Date/Time   NA 137 11/16/2012 2012   K 3.7  11/16/2012 2012   CL 102 11/16/2012 2012   CO2 24 11/16/2012 2012   GLUCOSE 105* 11/16/2012 2012   BUN 13 11/16/2012 2012   CREATININE 0.99 11/16/2012 2012   CALCIUM 9.4 11/16/2012 2012   GFRNONAA 76* 11/16/2012 2012   GFRAA 89* 11/16/2012 2012     ASSESSMENT AND PLAN C A D Asymptomatic. I last saw him a year ago he was still taking Aggrenox for a history of stroke. This has subsequently been stopped. He should take at least aspirin 81 mg daily for his history of coronary disease.  HYPERLIPIDEMIA  Excellent lipid profile  CHB (complete heart block) Pacemaker dependent  Pacemaker - Medtronic dual chamber, 2004, new generator 2012 Normal device function. No changes in  pacemaker settings at this visit.  They are compliant with remote pacemaker downloads peer  Falls frequently Because of Mr. Doddridge's dementia it  is hard to obtain a detailed history about the causes of the falls. I worry that he has orthostatic hypotension. Have recommended that he discontinue his metoprolol altogether. I've recommended that Mrs. Sahota discuss whether or not citalopram if still indicated with his primary care physician.   Patient Instructions  Your physician recommends that you return for lab work in: [facility] [employer] [facility] convenience.  STOP Metoprolol.  START Aspirin 81mg  daily.  Remote monitoring is used to monitor your Pacemaker of ICD from home. This monitoring reduces the number of office visits required to check your device to one time per year. It allows us to keep an eye on the functioning of your device to ensure it is working properly. You are scheduled for a device check from home on August 29, 2013. You may send your transmission at any time that day. If you have a wireless device, the transmission will be sent automatically. After your physician reviews your transmission, you will receive a postcard with your next transmission date.  Your physician recommends that you schedule a  follow-up appointment in: One year.        Orders Placed This Encounter  Procedures  . Lipid panel  . Comprehensive metabolic panel  . EKG 12-Lead   Meds ordered this encounter  Medications  . aspirin EC 81 MG tablet    Sig: Take 1 tablet (81 mg total) by mouth daily.    Dispense:  30 tablet    Refill:  0    Marcile Fuquay  Thurmon FairMihai Reegan Mctighe, MD, [facility] [phone] office [phone] pager

## 2013-05-30 NOTE — Assessment & Plan Note (Addendum)
Asymptomatic. I last saw him a year ago he was still taking Aggrenox for a history of stroke. This has subsequently been stopped. He should take at least aspirin 81 mg daily for his history of coronary disease.

## 2013-07-03 ENCOUNTER — Emergency Department (HOSPITAL_COMMUNITY): Payer: Medicare HMO

## 2013-07-03 ENCOUNTER — Emergency Department (HOSPITAL_COMMUNITY)
Admission: EM | Admit: 2013-07-03 | Discharge: 2013-07-03 | Disposition: A | Discharge status: Home / Self Care | Payer: Medicare HMO | Attending: Emergency Medicine | Admitting: Emergency Medicine

## 2013-07-03 ENCOUNTER — Telehealth: Payer: Self-pay

## 2013-07-03 DIAGNOSIS — Z79899 Other long term (current) drug therapy: Secondary | ICD-10-CM | POA: Insufficient documentation

## 2013-07-03 DIAGNOSIS — F0390 Unspecified dementia without behavioral disturbance: Secondary | ICD-10-CM | POA: Insufficient documentation

## 2013-07-03 DIAGNOSIS — E785 Hyperlipidemia, unspecified: Secondary | ICD-10-CM | POA: Insufficient documentation

## 2013-07-03 DIAGNOSIS — Z792 Long term (current) use of antibiotics: Secondary | ICD-10-CM | POA: Insufficient documentation

## 2013-07-03 DIAGNOSIS — Z7902 Long term (current) use of antithrombotics/antiplatelets: Secondary | ICD-10-CM | POA: Insufficient documentation

## 2013-07-03 DIAGNOSIS — R042 Hemoptysis: Secondary | ICD-10-CM

## 2013-07-03 DIAGNOSIS — I10 Essential (primary) hypertension: Secondary | ICD-10-CM | POA: Insufficient documentation

## 2013-07-03 DIAGNOSIS — I2510 Atherosclerotic heart disease of native coronary artery without angina pectoris: Secondary | ICD-10-CM | POA: Insufficient documentation

## 2013-07-03 DIAGNOSIS — Z7982 Long term (current) use of aspirin: Secondary | ICD-10-CM | POA: Insufficient documentation

## 2013-07-03 DIAGNOSIS — Z8673 Personal history of transient ischemic attack (TIA), and cerebral infarction without residual deficits: Secondary | ICD-10-CM | POA: Insufficient documentation

## 2013-07-03 DIAGNOSIS — Z87442 Personal history of urinary calculi: Secondary | ICD-10-CM | POA: Insufficient documentation

## 2013-07-03 DIAGNOSIS — Z950 Presence of cardiac pacemaker: Secondary | ICD-10-CM | POA: Insufficient documentation

## 2013-07-03 LAB — COMPREHENSIVE METABOLIC PANEL
ALBUMIN: 3.8 g/dL (ref 3.5–5.2)
ALK PHOS: 54 U/L (ref 39–117)
ALT: 21 U/L (ref 0–53)
AST: 25 U/L (ref 0–37)
BILIRUBIN TOTAL: 1.7 mg/dL — AB (ref 0.3–1.2)
BUN: 13 mg/dL (ref 6–23)
CHLORIDE: 101 meq/L (ref 96–112)
CO2: 28 meq/L (ref 19–32)
CREATININE: 1.19 mg/dL (ref 0.50–1.35)
Calcium: 9.5 mg/dL (ref 8.4–10.5)
GFR calc Af Amer: 65 mL/min — ABNORMAL LOW (ref >90)
GFR calc non Af Amer: 56 mL/min — ABNORMAL LOW (ref >90)
Glucose, Bld: 98 mg/dL (ref 70–99)
POTASSIUM: 4.2 meq/L (ref 3.7–5.3)
SODIUM: 139 meq/L (ref 137–147)
Total Protein: 6.5 g/dL (ref 6.0–8.3)

## 2013-07-03 LAB — CBC
HCT: 43.7 % (ref 39.0–52.0)
Hemoglobin: 15.2 g/dL (ref 13.0–17.0)
MCH: 32.1 pg (ref 26.0–34.0)
MCHC: 34.8 g/dL (ref 30.0–36.0)
MCV: 92.2 fL (ref 78.0–100.0)
PLATELETS: 180 10*3/uL (ref 150–400)
RBC: 4.74 MIL/uL (ref 4.22–5.81)
RDW: 13.7 % (ref 11.5–15.5)
WBC: 4.7 10*3/uL (ref 4.0–10.5)

## 2013-07-03 MED ORDER — CEPHALEXIN 500 MG PO CAPS: 500.0000 mg | ORAL_CAPSULE | Freq: Three times a day (TID) | ORAL | Status: DC

## 2013-07-03 NOTE — Discharge Instructions (Signed)
Follow up with your ent md next week.

## 2013-07-03 NOTE — ED Notes (Signed)
Pt has returned from X-Ray

## 2013-07-03 NOTE — Telephone Encounter (Signed)
Agree with ER.  Thanks.  

## 2013-07-03 NOTE — ED Provider Notes (Signed)
CSN: [id number]     Arrival date & time 07/03/13  1339 History   First MD Initiated Contact with Patient 07/03/13 1355     Chief Complaint  Patient presents with  . Hematemesis    "spitting up blood"     (Consider location/radiation/quality/duration/timing/severity/associated sxs/prior Treatment) Patient is a 80 y.o. male presenting with pharyngitis. The history is provided by the spouse (the pt has been spitting some blood up for one day.  not vomiting blood).  Sore Throat This is a new problem. The current episode started yesterday. The problem occurs constantly. The problem has not changed since onset.Pertinent negatives include no chest pain, no abdominal pain and no headaches. Nothing aggravates the symptoms. Nothing relieves the symptoms.    Past Medical History  Diagnosis Date  . Hypertension   . Hyperlipidemia   . Renal stones     stones  . Stroke   . CAD (coronary artery disease)   . Dementia     after CVA  . Foot drop, left   . Dementia   . Complication of anesthesia     trouble waking up in childhood   Past Surgical History  Procedure Laterality Date  . Appendectomy    . Tonsillectomy and adenoidectomy  1960's  . 3 back surgeries  1980's  . Pacemaker insertion  2000  . 5 bypass  04/29/2005  . Stroke during bypass surgery 04/29/2005 and another 05/17/05  2007  . Total knee replacement, left  10/07  . Total knee replacement, right  2/09  . 2nd pacemaker, generator (battery) replaced  01/08/11   Family History  Problem Relation Age of Onset  . Cancer Brother     stomach  . Heart disease Other   . Heart disease Mother   . Heart disease Father   . Colon cancer Neg Hx   . Prostate cancer Neg Hx    History  Substance Use Topics  . Smoking status: Never Smoker   . Smokeless tobacco: Never Used  . Alcohol Use: No    Review of Systems  Constitutional: Negative for appetite change and fatigue.  HENT: Negative for congestion, ear discharge and sinus pressure.         Spitting up blood  Eyes: Negative for discharge.  Respiratory: Negative for cough.   Cardiovascular: Negative for chest pain.  Gastrointestinal: Negative for abdominal pain and diarrhea.  Genitourinary: Negative for frequency and hematuria.  Musculoskeletal: Negative for back pain.  Skin: Negative for rash.  Neurological: Negative for seizures and headaches.  Psychiatric/Behavioral: Negative for hallucinations.      Allergies  Review of patient's allergies indicates no known allergies.  Home Medications   Prior to Admission medications   Medication Sig Start Date End Date Taking? Authorizing Provider  aspirin EC 81 MG tablet Take 1 tablet (81 mg total) by mouth daily. 05/29/13  Yes Mihai Croitoru, MD  calcium carbonate (OS-CAL) 600 MG TABS Take 600 mg by mouth 2 (two) times daily with a meal.   Yes Historical Provider, MD  Cetirizine HCl (ZYRTEC ALLERGY) 10 MG CAPS Take 10 mg by mouth daily as needed.    Yes Historical Provider, MD  citalopram (CELEXA) 20 MG tablet Take 1 tablet (20 mg total) by mouth daily. 05/15/13  Yes Nilda RiggsNancy Carolyn Martin, NP  clopidogrel (PLAVIX) 75 MG tablet Take 1 tablet (75 mg total) by mouth daily with breakfast. 05/15/13  Yes Nilda RiggsNancy Carolyn Martin, NP  donepezil (ARICEPT) 23 MG TABS tablet Take 1 tablet (23 mg  total) by mouth daily. 05/15/13  Yes Nilda RiggsNancy Carolyn Martin, NP  l-methylfolate-B6-B12 (METANX) 3-35-2 MG TABS Take 1 tablet by mouth every morning.     Yes Historical Provider, MD  simvastatin (ZOCOR) 40 MG tablet Take 40 mg by mouth every evening.   Yes Historical Provider, MD  cephALEXin (KEFLEX) 500 MG capsule Take 1 capsule (500 mg total) by mouth 3 (three) times daily. 07/03/13   Benny LennertJoseph L Auria Mckinlay, MD   BP 126/62  Pulse 75  Temp(Src) 97.7 F (36.5 C) (Oral)  Resp 14  SpO2 100% Physical Exam  Constitutional: He appears well-developed.  HENT:  Head: Normocephalic.  Eyes: Conjunctivae and EOM are normal. No scleral icterus.  Neck: Neck supple.  No thyromegaly present.  Cardiovascular: Normal rate and regular rhythm.  Exam reveals no gallop and no friction rub.   No murmur heard. Pulmonary/Chest: No stridor. He has no wheezes. He has no rales. He exhibits no tenderness.  Abdominal: He exhibits no distension. There is no tenderness. There is no rebound.  Musculoskeletal: Normal range of motion. He exhibits no edema.  Lymphadenopathy:    He has no cervical adenopathy.  Neurological: He exhibits normal muscle tone. Coordination normal.  Pt oriented to person only.  But alert  Skin: No rash noted. No erythema.  Psychiatric: He has a normal mood and affect. His behavior is normal.    ED Course  Procedures (including critical care time) Labs Review Labs Reviewed  COMPREHENSIVE METABOLIC PANEL - Abnormal; Notable for the following:    Total Bilirubin 1.7 (*)    GFR calc non Af Amer 56 (*)    GFR calc Af Amer 65 (*)    All other components within normal limits  CBC    Imaging Review Dg Chest 2 View  07/03/2013   CLINICAL DATA:  Hematemesis.  EXAM: CHEST  2 VIEW  COMPARISON:  Prior radiograph from 11/16/2012  FINDINGS: Right-sided pacemaker with sequelae of prior CABG again noted, unchanged. Cardiac and mediastinal silhouettes are stable, and remain within normal limits.  The lungs are normally inflated. No airspace consolidation, pleural effusion, or pulmonary edema is identified. There is no pneumothorax.  No acute osseous abnormality identified.  IMPRESSION: No active cardiopulmonary disease.   Electronically Signed   By: Rise MuBenjamin  McClintock M.D.   On: 07/03/2013 15:50     EKG Interpretation   Date/Time:  Tuesday Jul 03 2013 13:54:29 EDT Ventricular Rate:  72 PR Interval:  155 QRS Duration: 159 QT Interval:  455 QTC Calculation: 498 R Axis:   -124 Text Interpretation:  Atrial-sensed ventricular-paced rhythm No further  analysis attempted due to paced rhythm Confirmed by WARD,  DO, KRISTEN  [phone]([id number]) on 07/03/2013 2:48:05  PM      MDM   Final diagnoses:  Spitting blood        Benny LennertJoseph L Page Lancon, MD 07/03/13 [phone]

## 2013-07-03 NOTE — ED Notes (Signed)
Family reports pt has been spitting up blood at night since Saturday. Started to spit up blood during the day starting yesterday. Denies throwing up blood, cough or sore throat. Pt has hx of dementia.

## 2013-07-03 NOTE — Telephone Encounter (Signed)
Mrs Hardt left v/m that pt was spitting up blood during the night on and off since 06/30/13. I called Mrs Elby ShowersSockwell and she is [facility] now [facility] pt is spitting up more blood and more often. Mrs Elby ShowersSockwell was appreciative for cb but she stated she had to go.

## 2013-07-03 NOTE — ED Notes (Signed)
MD at bedside. 

## 2013-07-03 NOTE — ED Notes (Signed)
Patient transported to X-ray 

## 2013-07-04 ENCOUNTER — Telehealth: Payer: Self-pay | Admitting: Family Medicine

## 2013-07-04 DIAGNOSIS — R042 Hemoptysis: Secondary | ICD-10-CM

## 2013-07-04 NOTE — Telephone Encounter (Signed)
Spoke to patient's wife and was advised that she has already been contacted and appointment has been scheduled.

## 2013-07-04 NOTE — Telephone Encounter (Signed)
Pt wife called and requested referral to Dr. Jenne CampusMcQueen at [facility]. [facility] was [facility] 07/03/13 for spitting up blood and Dr. Hardie PulleySammit recommended him see an ENT due to infection in throat.   Call back [phone]

## 2013-07-04 NOTE — Telephone Encounter (Signed)
Ordered. Thanks

## 2013-07-10 LAB — LIPID PANEL
CHOL/HDL RATIO: 2.2 ratio
Cholesterol: 118 mg/dL (ref 0–200)
HDL: 54 mg/dL (ref >39)
LDL CALC: 47 mg/dL (ref 0–99)
TRIGLYCERIDES: 86 mg/dL (ref <150)
VLDL: 17 mg/dL (ref 0–40)

## 2013-07-10 LAB — COMPREHENSIVE METABOLIC PANEL
ALK PHOS: 48 U/L (ref 39–117)
ALT: 19 U/L (ref 0–53)
AST: 23 U/L (ref 0–37)
Albumin: 4 g/dL (ref 3.5–5.2)
BILIRUBIN TOTAL: 1.8 mg/dL — AB (ref 0.2–1.2)
BUN: 12 mg/dL (ref 6–23)
CO2: 25 meq/L (ref 19–32)
CREATININE: 1.03 mg/dL (ref 0.50–1.35)
Calcium: 9.7 mg/dL (ref 8.4–10.5)
Chloride: 102 mEq/L (ref 96–112)
Glucose, Bld: 88 mg/dL (ref 70–99)
Potassium: 4.9 mEq/L (ref 3.5–5.3)
SODIUM: 139 meq/L (ref 135–145)
TOTAL PROTEIN: 6.3 g/dL (ref 6.0–8.3)

## 2013-07-24 ENCOUNTER — Telehealth: Payer: Self-pay

## 2013-07-24 ENCOUNTER — Telehealth: Payer: Self-pay | Admitting: Family Medicine

## 2013-07-24 NOTE — Telephone Encounter (Signed)
I wouldn't do anything differently at this point today.  Local care to the wound. That BP is okay as long as he isn't lightheaded. If the cough continues, then we can check him here in the clinic. Thanks.

## 2013-07-24 NOTE — Telephone Encounter (Signed)
Mrs Genova said pt has dementia and fell coming out of bathroom about 5 AM this morning;no apparent injury except pt has skinned rt knee but did not have any bleeding. Mrs Nazareno has taken pts BP 117/62 P70. Pt has non prod cough that started on 07/23/13, no fever,no SOB, no wheezing, no head congestion. Mrs [facility] request cb. [facility].

## 2013-07-24 NOTE — Telephone Encounter (Signed)
Wife advised. 

## 2013-07-24 NOTE — Telephone Encounter (Signed)
Patient Information:  Caller Name: Nettie Elm  Phone: [phone]  Patient: Ratha, Hartsel  Gender: Male  DOB: [birth year]  Age: 80 Years  PCP: Crawford Givens Clelia Croft) [facility])  Office Follow Up:  Does the office need to follow up with this patient?: No  Instructions For The Office: N/A  RN Note:  Wife calling regarding Patient/Jomo who has congested cough.  Denies fever.  Admits to having problem with allergies.  Otherwise preforming ADL's within normal limits.  Symptoms  Reason For Call & Symptoms: c/o cough  Reviewed Health History In EMR: Yes  Reviewed Medications In EMR: Yes  Reviewed Allergies In EMR: Yes  Reviewed Surgeries / Procedures: Yes  Date of Onset of Symptoms: 07/23/2013  Guideline(s) Used:  Cough  Disposition Per Guideline:   Home Care  Reason For Disposition Reached:   Cough with no complications  Advice Given:  Coughing Spasms:  Drink warm fluids. Inhale warm mist (Reason: both relax the airway and loosen up the phlegm).  Suck on cough drops or hard candy to coat the irritated throat.  Cough Medicines:  Home Remedy - Honey: This old home remedy has been shown to help decrease coughing at night. The adult dosage is 2 teaspoons (10 ml) at bedtime. Honey should not be given to infants under one year of age.  Prevent Dehydration:  Drink adequate liquids.  This will help soothe an irritated or dry throat and loosen up the phlegm.  Call Back If:  Difficulty breathing  Cough lasts more than 3 weeks  Fever lasts > 3 days  You become worse.  Patient Will Follow Care Advice:  YES

## 2013-07-27 ENCOUNTER — Ambulatory Visit (INDEPENDENT_AMBULATORY_CARE_PROVIDER_SITE_OTHER): Payer: Medicare HMO | Admitting: Internal Medicine

## 2013-07-27 ENCOUNTER — Encounter: Payer: Self-pay | Admitting: Internal Medicine

## 2013-07-27 ENCOUNTER — Other Ambulatory Visit: Payer: Self-pay | Admitting: Neurology

## 2013-07-27 VITALS — BP 120/60 | HR 74 | Temp 97.1°F | Wt 199.5 lb

## 2013-07-27 DIAGNOSIS — R82998 Other abnormal findings in urine: Secondary | ICD-10-CM

## 2013-07-27 DIAGNOSIS — N390 Urinary tract infection, site not specified: Secondary | ICD-10-CM

## 2013-07-27 LAB — POCT URINALYSIS DIPSTICK
Glucose, UA: NEGATIVE
KETONES UA: NEGATIVE
Nitrite, UA: POSITIVE
Urobilinogen, UA: 1
pH, UA: 6

## 2013-07-27 MED ORDER — SULFAMETHOXAZOLE-TMP DS 800-160 MG PO TABS: 1.0000 | ORAL_TABLET | Freq: Two times a day (BID) | ORAL | Status: DC

## 2013-07-27 NOTE — Progress Notes (Signed)
Pre visit review using our clinic review tool, if applicable. No additional management support is needed unless otherwise documented below in the visit note. 

## 2013-07-27 NOTE — Progress Notes (Signed)
HPI  Pt presents to the clinic today with c/o dark urine. His family reports this has been going on for at least 2-3 days. He was seen by the home health nurse yesterday who noticed his urine was dark. She advised him to come get checked for a UTI. His wife reports that he has not been adequately drinking fluids. He does have a history of kidney stones. He is not in any pain today. He denies dysuria, urgency or frequency. He denies fever, chills or body aches.   Review of Systems  Past Medical History  Diagnosis Date  . Hypertension   . Hyperlipidemia   . Renal stones     stones  . Stroke   . CAD (coronary artery disease)   . Dementia     after CVA  . Foot drop, left   . Dementia   . Complication of anesthesia     trouble waking up in childhood    Family History  Problem Relation Age of Onset  . Cancer Brother     stomach  . Heart disease Other   . Heart disease Mother   . Heart disease Father   . Colon cancer Neg Hx   . Prostate cancer Neg Hx     History   Social History  . Marital Status: Married    Spouse Name: N/A    Number of Children: N/A  . Years of Education: N/A   Occupational History  . Retired Radio broadcast assistant    Social History Main Topics  . Smoking status: Never Smoker   . Smokeless tobacco: Never Used  . Alcohol Use: No  . Drug Use: No  . Sexual Activity: Not on file   Other Topics Concern  . Not on file   Social History Narrative   Education:  12th grade   Married 1957   Retired Surveyor, minerals   From [place]    No Known Allergies  Constitutional: Denies fever, malaise, fatigue, headache or abrupt weight changes.   GU: Pt reports dark urine. Denies urgency, frequency, dysuria, burning sensation, blood in urine, odor or discharge. Skin: Denies redness, rashes, lesions or ulcercations.   No other specific complaints in a complete review of systems (except as listed in HPI above).    Objective:   Physical Exam  BP 120/60   Pulse 74  Temp(Src) 97.1 F (36.2 C) (Oral)  Wt 199 lb 8 oz (90.493 kg)  SpO2 98%  Wt 199 lb 8 oz (90.493 kg) Wt Readings from Last 3 Encounters:  07/27/13 199 lb 8 oz (90.493 kg)  05/29/13 201 lb (91.173 kg)  05/15/13 200 lb (90.719 kg)    General: Appears his stated age, chronically ill appearing in NAD. Cardiovascular: Normal rate and rhythm. S1,S2 noted.  No murmur, rubs or gallops noted. No JVD or BLE edema. No carotid bruits noted. Pulmonary/Chest: Normal effort and positive vesicular breath sounds. No respiratory distress. No wheezes, rales or ronchi noted.  Abdomen: Soft and nontender. Normal bowel sounds, no bruits noted. No distention or masses noted. Liver, spleen and kidneys non palpable. Tender to palpation over the bladder area. No CVA tenderness.      Assessment & Plan:   Dark urine:  Urinalysis: trace leuks, pos nitrites, large blood eRx sent if for Septra BID x 10 days Will send urine culture  Drink plenty of fluids  RTC as needed or if symptoms persist.

## 2013-07-27 NOTE — Patient Instructions (Addendum)
Urinary Tract Infection  Urinary tract infections (UTIs) can develop anywhere along your urinary tract. Your urinary tract is your body's drainage system for removing wastes and extra water. Your urinary tract includes two kidneys, two ureters, a bladder, and a urethra. Your kidneys are a pair of bean-shaped organs. Each kidney is about the size of your fist. They are located below your ribs, one on each side of your spine.  CAUSES  Infections are caused by microbes, which are microscopic organisms, including fungi, viruses, and bacteria. These organisms are so small that they can only be seen through a microscope. Bacteria are the microbes that most commonly cause UTIs.  SYMPTOMS   Symptoms of UTIs may vary by age and gender of the patient and by the location of the infection. Symptoms in young women typically include a frequent and intense urge to urinate and a painful, burning feeling in the bladder or urethra during urination. Older women and men are more likely to be tired, shaky, and weak and have muscle aches and abdominal pain. A fever may mean the infection is in your kidneys. Other symptoms of a kidney infection include pain in your back or sides below the ribs, nausea, and vomiting.  DIAGNOSIS  To diagnose a UTI, your caregiver will ask you about your symptoms. Your caregiver also will ask to provide a urine sample. The urine sample will be tested for bacteria and white blood cells. White blood cells are made by your body to help fight infection.  TREATMENT   Typically, UTIs can be treated with medication. Because most UTIs are caused by a bacterial infection, they usually can be treated with the use of antibiotics. The choice of antibiotic and length of treatment depend on your symptoms and the type of bacteria causing your infection.  HOME CARE INSTRUCTIONS   If you were prescribed antibiotics, take them exactly as your caregiver instructs you. Finish the medication even if you feel better after you  have only taken some of the medication.   Drink enough water and fluids to keep your urine clear or pale yellow.   Avoid caffeine, tea, and carbonated beverages. They tend to irritate your bladder.   Empty your bladder often. Avoid holding urine for long periods of time.   Empty your bladder before and after sexual intercourse.   After a bowel movement, women should cleanse from front to back. Use each tissue only once.  SEEK MEDICAL CARE IF:    You have back pain.   You develop a fever.   Your symptoms do not begin to resolve within 3 days.  SEEK IMMEDIATE MEDICAL CARE IF:    You have severe back pain or lower abdominal pain.   You develop chills.   You have nausea or vomiting.   You have continued burning or discomfort with urination.  MAKE SURE YOU:    Understand these instructions.   Will watch your condition.   Will get help right away if you are not doing well or get worse.  Document Released: 11/25/2004 Document Revised: 08/17/2011 Document Reviewed: 03/26/2011  ExitCare Patient Information 2014 [employer].

## 2013-07-27 NOTE — Addendum Note (Signed)
Addended by: Roena Malady on: 07/27/2013 03:31 PM   Modules accepted: Orders

## 2013-07-29 LAB — URINE CULTURE

## 2013-07-30 ENCOUNTER — Telehealth: Payer: Self-pay | Admitting: Nurse Practitioner

## 2013-07-30 NOTE — Telephone Encounter (Signed)
TC to wife left message and made her aware she can stop Aricept. When last seen in March is MMSE was only 12/30 with lots of word finding difficulty. Sometimes when patients stop the medication however the family does realize that the medication was beneficial. I will leave it up to her. Glad she has finally obtained some help.

## 2013-07-30 NOTE — Telephone Encounter (Signed)
Patient's wife calling--needs to discuss medication Donepezil--wants to know if this drug is only for patient's memory--please call--thank you.

## 2013-07-30 NOTE — Telephone Encounter (Signed)
I called back.  Spoke with Ms [employer].  She said even though the patient is taking Aricept, he remembers absolutely nothing.  Says you can tell him something and within seconds he forgets what was said.  States he doesn't know where anything is including the bathroom, bedroom or kitchen.  Indicates they will have full time help with his care at home starting today.  She does not think Aricept is beneficial and would like to get a message to [place] and see if they can discontinue the med.  I asked her if he was taking Namenda, and she said he stopped taking that several months ago, but she does not remember why, and does not wish to restart that at this time.   Please advise.  Thank you.

## 2013-08-01 ENCOUNTER — Encounter: Payer: Self-pay | Admitting: Family Medicine

## 2013-08-01 ENCOUNTER — Ambulatory Visit (INDEPENDENT_AMBULATORY_CARE_PROVIDER_SITE_OTHER): Payer: Medicare HMO | Admitting: Family Medicine

## 2013-08-01 VITALS — BP 120/80 | HR 79 | Temp 98.2°F | Wt 198.0 lb

## 2013-08-01 DIAGNOSIS — F0390 Unspecified dementia without behavioral disturbance: Secondary | ICD-10-CM

## 2013-08-01 NOTE — Patient Instructions (Signed)
Finish the antibiotics and let me know what you need for certification.   Take care.  Glad to see you.

## 2013-08-01 NOTE — Progress Notes (Signed)
Pre visit review using our clinic review tool, if applicable. No additional management support is needed unless otherwise documented below in the visit note.  Dementia. H/o CVA, falls.  Has been hallucinating, visually.  Mumbles and laughs w/o cause seen.  These aren't new issues for him.  occ urine incontinence.  Possible/likely UTI recently- notes reviewed. Started on abx with clearing of urine but no sig change in mentation.  Sig family strain caring for patient.  Tremor in hands has increased gradually.   PMH and SH reviewed  ROS: See HPI, otherwise noncontributory.  Meds, vitals, and allergies reviewed.   nad In wheelchair ncat except for a small bruise on the occiput noted after a fall Mmm rrr ctab abd soft occ can follow simple 1 step commands, but not always Unable to reliably check CN fully as he can't state his sensation on the face.  He can eye track Tremor noted on the hands B Grip appears equal  Midline neck and back not ttp Bruising noted on upper back (reported after a fall)

## 2013-08-02 ENCOUNTER — Encounter: Payer: Self-pay | Admitting: Family Medicine

## 2013-08-02 NOTE — Assessment & Plan Note (Signed)
With falls and patient needs more care, supervision.  Family trying to arrange for extra home care, forms signed today.  I would finish the abx on the chance that a UTI was contributing. I would expect him to decline and eventually need placement and the family is working on placement concerns.  Continue as is with meds for now. >25 minutes spent in face to face time with patient, >50% spent in counselling or coordination of care.

## 2013-08-14 ENCOUNTER — Telehealth: Payer: Self-pay

## 2013-08-14 NOTE — Telephone Encounter (Signed)
I've been thinking about this.  I would not start back on abx at this point, w/o a fever, since the last Ucx was negative.  Have him drink as much fluid as possible to see if that affects the appearance of his urine and then update us tomorrow as needed. I think that is the safest/best option.  Thanks.

## 2013-08-14 NOTE — Telephone Encounter (Signed)
Wife advised. 

## 2013-08-14 NOTE — Telephone Encounter (Signed)
Pt was seen 07/27/13 and finished antibiotics about 5 days ago; when pt finished antibiotic pt was doing OK; today pts urine appears very dark (? Blood in urine) pt complaining lower back pain ( not sure where back hurting) and appears to have pain when urinates. No fever,nausea or vomiting,no unusual significant confusion and no abd pain. [facility]. Cannot bring pt to office.

## 2013-08-15 ENCOUNTER — Telehealth: Payer: Self-pay | Admitting: Family Medicine

## 2013-08-15 NOTE — Telephone Encounter (Signed)
Noted, thanks.  Keep going with fluids by mouth as tolerated.

## 2013-08-15 NOTE — Telephone Encounter (Signed)
Patient wife called to let you know pt's feeling better today.  There's no blood in his urine today.

## 2013-08-23 ENCOUNTER — Other Ambulatory Visit: Payer: Self-pay | Admitting: Cardiovascular Disease

## 2013-08-24 NOTE — Telephone Encounter (Signed)
Rx refill sent to pharmacy. 

## 2013-08-29 ENCOUNTER — Encounter: Payer: Commercial Managed Care - HMO | Admitting: *Deleted

## 2013-08-29 ENCOUNTER — Telehealth: Payer: Self-pay | Admitting: Cardiology

## 2013-08-29 NOTE — Telephone Encounter (Signed)
LMOVM reminding pt to send remote transmission.   

## 2013-08-30 ENCOUNTER — Encounter: Payer: Self-pay | Admitting: Cardiology

## 2013-09-07 ENCOUNTER — Telehealth: Payer: Self-pay | Admitting: Family Medicine

## 2013-09-07 NOTE — Telephone Encounter (Signed)
Please call them back.  I would try OTC melatonin. That can help with jet lag or when a person gets days and nights reversed.  It's likely the safest option.  If not not better, then notify early next week.  Thanks.

## 2013-09-07 NOTE — Telephone Encounter (Signed)
Wife advised. 

## 2013-09-07 NOTE — Telephone Encounter (Signed)
Patient Information:  Caller Name: Sam  Phone: [phone]  Patient: Kurt [name], Kurt [name]  Gender: Male  DOB: [birth year]  Age: 80 Years  PCP: Crawford Givensuncan, Graham Clelia Croft([place]) [facility](Family Practice)  Office Follow Up:  Does the office need to follow up with this patient?: Yes  Instructions For The Office: Office please review and call caregiver back regarding a sleep aid for the pt   Symptoms  Reason For Call & Symptoms: Home Health Care Giver calling to request a sleep aid; the night caregiver stated that he kept trying to get out of bed; sleeping in the day and not sleeping at night; they are trying to keep him stimulated during the day but not helping; sx started 1 week ago;  Reviewed Health History In EMR: Yes  Reviewed Medications In EMR: Yes  Reviewed Allergies In EMR: Yes  Reviewed Surgeries / Procedures: Yes  Date of Onset of Symptoms: 08/31/2013  Guideline(s) Used:  No Protocol Available - Information Only  Disposition Per Guideline:   Discuss with PCP and Callback by Nurse Today  Reason For Disposition Reached:   Nursing judgment  Advice Given:  Call Back If:  You become worse.  Patient Will Follow Care Advice:  YES

## 2013-09-24 ENCOUNTER — Telehealth: Payer: Self-pay | Admitting: Family Medicine
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
## 2013-09-24 NOTE — Telephone Encounter (Signed)
Sounds like ER is the most reasonable option at this time.

## 2013-09-24 NOTE — Telephone Encounter (Signed)
Wife advised. 
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
## 2013-09-25 ENCOUNTER — Encounter (HOSPITAL_COMMUNITY): Payer: Self-pay | Admitting: Emergency Medicine

## 2013-09-25 ENCOUNTER — Emergency Department (HOSPITAL_COMMUNITY)
Admission: EM | Admit: 2013-09-25 | Discharge: 2013-09-27 | Disposition: A | Discharge status: Home / Self Care | Payer: Medicare HMO | Attending: Dermatology | Admitting: Dermatology

## 2013-09-25 DIAGNOSIS — Z8673 Personal history of transient ischemic attack (TIA), and cerebral infarction without residual deficits: Secondary | ICD-10-CM | POA: Insufficient documentation

## 2013-09-25 DIAGNOSIS — Z862 Personal history of diseases of the blood and blood-forming organs and certain disorders involving the immune mechanism: Secondary | ICD-10-CM | POA: Insufficient documentation

## 2013-09-25 DIAGNOSIS — Z8639 Personal history of other endocrine, nutritional and metabolic disease: Secondary | ICD-10-CM | POA: Insufficient documentation

## 2013-09-25 DIAGNOSIS — F0390 Unspecified dementia without behavioral disturbance: Secondary | ICD-10-CM

## 2013-09-25 DIAGNOSIS — Z87442 Personal history of urinary calculi: Secondary | ICD-10-CM | POA: Insufficient documentation

## 2013-09-25 DIAGNOSIS — Y939 Activity, unspecified: Secondary | ICD-10-CM | POA: Insufficient documentation

## 2013-09-25 DIAGNOSIS — R296 Repeated falls: Secondary | ICD-10-CM | POA: Diagnosis not present

## 2013-09-25 DIAGNOSIS — Z043 Encounter for examination and observation following other accident: Secondary | ICD-10-CM | POA: Insufficient documentation

## 2013-09-25 DIAGNOSIS — Z951 Presence of aortocoronary bypass graft: Secondary | ICD-10-CM | POA: Diagnosis not present

## 2013-09-25 DIAGNOSIS — I2510 Atherosclerotic heart disease of native coronary artery without angina pectoris: Secondary | ICD-10-CM | POA: Insufficient documentation

## 2013-09-25 DIAGNOSIS — Y929 Unspecified place or not applicable: Secondary | ICD-10-CM | POA: Diagnosis not present

## 2013-09-25 DIAGNOSIS — Z7982 Long term (current) use of aspirin: Secondary | ICD-10-CM | POA: Insufficient documentation

## 2013-09-25 DIAGNOSIS — Z792 Long term (current) use of antibiotics: Secondary | ICD-10-CM | POA: Diagnosis not present

## 2013-09-25 DIAGNOSIS — I10 Essential (primary) hypertension: Secondary | ICD-10-CM | POA: Insufficient documentation

## 2013-09-25 LAB — URINE MICROSCOPIC-ADD ON

## 2013-09-25 LAB — CBC
HEMATOCRIT: 43.3 % (ref 39.0–52.0)
HEMOGLOBIN: 15 g/dL (ref 13.0–17.0)
MCH: 32.2 pg (ref 26.0–34.0)
MCHC: 34.6 g/dL (ref 30.0–36.0)
MCV: 92.9 fL (ref 78.0–100.0)
Platelets: 196 10*3/uL (ref 150–400)
RBC: 4.66 MIL/uL (ref 4.22–5.81)
RDW: 13.8 % (ref 11.5–15.5)
WBC: 5.5 10*3/uL (ref 4.0–10.5)

## 2013-09-25 LAB — COMPREHENSIVE METABOLIC PANEL
ALK PHOS: 66 U/L (ref 39–117)
ALT: 17 U/L (ref 0–53)
AST: 29 U/L (ref 0–37)
Albumin: 3.9 g/dL (ref 3.5–5.2)
Anion gap: 13 (ref 5–15)
BUN: 9 mg/dL (ref 6–23)
CALCIUM: 9.7 mg/dL (ref 8.4–10.5)
CO2: 24 meq/L (ref 19–32)
Chloride: 101 mEq/L (ref 96–112)
Creatinine, Ser: 0.88 mg/dL (ref 0.50–1.35)
GFR, EST NON AFRICAN AMERICAN: 80 mL/min — AB (ref >90)
GLUCOSE: 95 mg/dL (ref 70–99)
POTASSIUM: 4.3 meq/L (ref 3.7–5.3)
SODIUM: 138 meq/L (ref 137–147)
Total Bilirubin: 1.6 mg/dL — ABNORMAL HIGH (ref 0.3–1.2)
Total Protein: 7 g/dL (ref 6.0–8.3)

## 2013-09-25 LAB — URINALYSIS, ROUTINE W REFLEX MICROSCOPIC
Glucose, UA: NEGATIVE mg/dL
Ketones, ur: NEGATIVE mg/dL
Nitrite: NEGATIVE
PROTEIN: 100 mg/dL — AB
Specific Gravity, Urine: 1.02 (ref 1.005–1.030)
UROBILINOGEN UA: 1 mg/dL (ref 0.0–1.0)
pH: 5 (ref 5.0–8.0)

## 2013-09-25 MED ORDER — CEPHALEXIN 500 MG PO CAPS
500.0000 mg | ORAL_CAPSULE | Freq: Three times a day (TID) | ORAL | Status: DC
  Administered 2013-09-25 – 2013-09-27 (×5): 500 mg via ORAL
  Filled 2013-09-25 (×5): qty 1

## 2013-09-25 MED ORDER — DIPHENHYDRAMINE HCL 25 MG PO CAPS
25.0000 mg | ORAL_CAPSULE | Freq: Once | ORAL | Status: AC
  Administered 2013-09-25: 25 mg via ORAL
  Filled 2013-09-25: qty 1

## 2013-09-25 NOTE — Clinical Social Work Placement (Signed)
Clinical Social Work Department CLINICAL SOCIAL WORK PLACEMENT NOTE 09/25/2013  Patient:  Wenda LowSOCKWELL,Alfonzia A  Account Number:  [id number] Admit date:  09/25/2013  Clinical Social Worker:  Robin SearingJANET Skii Cleland, LCSWA  Date/time:  09/25/2013 02:29 PM  Clinical Social Work is seeking post-discharge placement for this patient at the following level of care:   SKILLED NURSING   (*CSW will update this form in Epic as items are completed)   09/25/2013  Patient/family provided with [facility] Department of Clinical Social Work's list of facilities offering this level of care within the geographic area requested by the patient (or if unable, by the patient's family).  09/25/2013  Patient/family informed of their freedom to choose among providers that offer the needed level of care, that participate in Medicare, Medicaid or managed care program needed by the patient, have an available bed and are willing to accept the patient.  09/25/2013  Patient/family informed of [facility]' ownership interest in [facility], as well as of the fact that they are under no obligation to receive care at this facility.  PASARR submitted to EDS on 09/25/2013 PASARR number received on 09/25/2013  FL2 transmitted to all facilities in geographic area requested by pt/family on  09/25/2013 FL2 transmitted to all facilities within larger geographic area on   Patient informed that his/her managed care company has contracts with or will negotiate with  certain facilities, including the following:     Patient/family informed of bed offers received:   Patient chooses bed at  Physician recommends and patient chooses bed at    Patient to be transferred to  on   Patient to be transferred to facility by  Patient and family notified of transfer on  Name of family member notified:    The following physician request were entered in Epic:   Additional Comments: Reece LevyJanet Izreal Kock, MSW, Theresia MajorsLCSWA [phone]

## 2013-09-25 NOTE — ED Notes (Signed)
Social work at bedside.  

## 2013-09-25 NOTE — ED Notes (Addendum)
Pt with wife and sister in law, wife states that pt has alzheimer's and is now beginning to fall more, getting violent and having moments when pt is jerking. States pt has more bruises, has one on chin of left leg. Pt from home. Wife states she can't take care of pt anymore and may need to talk to Child psychotherapistsocial worker. Wife states pt is not taking home meds, he spits them out.

## 2013-09-25 NOTE — Evaluation (Signed)
Physical Therapy Evaluation Patient Details Name: Kurt [name] MRN: [id number] DOB: May 23, 1931 Today's Date: 09/25/2013   History of Present Illness  pt  brought to ED with hx advanced dementia x 3 years, presents from home, spouse indicating can no longer manage his behaviors at home, requests placement.  Also states urine seems very strong/odorous, and questions possible uti. Pt has had multiple falls recently. Has been home with 24/7 caregivers.  Clinical Impression  Pt was able to participate in evaluation and walk in hall with 2 persons assist. Pt requires frequent cues for safety and for following functional activity. Pt will require 24/7 caregivers in a more monitored environment.    Follow Up Recommendations SNF;Supervision/Assistance - 24 hour    Equipment Recommendations  None recommended by PT    Recommendations for Other Services       Precautions / Restrictions Precautions Precautions: Fall Precaution Comments: has been combative PTA      Mobility  Bed Mobility Overal bed mobility: Needs Assistance;+ 2 for safety/equipment Bed Mobility: Supine to Sit;Sit to Supine     Supine to sit: Mod assist;+2 for safety/equipment Sit to supine: +2 for safety/equipment;Mod assist   General bed mobility comments: tactile cues to initiate activity to move to sitting to edge and then back into bed., Hand over hand  cues to sidestep to get up into bed.required to pick legs up and place them onto bed as pt would not.   Transfers Overall transfer level: Needs assistance Equipment used: Rolling walker (2 wheeled) Transfers: Sit to/from Stand Sit to Stand: Mod assist         General transfer comment: hand over hand cues to stand and sit down.  Ambulation/Gait Ambulation/Gait assistance: +2 safety/equipment Ambulation Distance (Feet): 50 Feet Assistive device: Rolling walker (2 wheeled)       General Gait Details: cues to turn around and head back to  room.  Stairs            Wheelchair Mobility    Modified Rankin (Stroke Patients Only)       Balance Overall balance assessment: Needs assistance;History of Falls Sitting-balance support: Feet supported;Bilateral upper extremity supported Sitting balance-Leahy Scale: Fair     Standing balance support: No upper extremity supported;During functional activity Standing balance-Leahy Scale: Poor                               Pertinent Vitals/Pain Appears to have pain in R leg.    Home Living Family/patient expects to be discharged to:: Skilled nursing facility               Home Equipment: Walker - 2 wheels      Prior Function Level of Independence: Needs assistance               Hand Dominance        Extremity/Trunk Assessment   Upper Extremity Assessment: LUE deficits/detail       LUE Deficits / Details: noted decreased fine motor of hand, decreased grip on RW   Lower Extremity Assessment: Overall WFL for tasks assessed      Cervical / Trunk Assessment: Normal  Communication   Communication: Receptive difficulties  Cognition Arousal/Alertness: Awake/alert Behavior During Therapy: Restless Overall Cognitive Status: History of cognitive impairments - at baseline (pt required frequent redirection and cues to participate in functional activity, redirection)  General Comments      Exercises        Assessment/Plan    PT Assessment Patient needs continued PT services  PT Diagnosis Difficulty walking;Altered mental status   PT Problem List Decreased activity tolerance;Decreased strength;Decreased range of motion;Decreased mobility;Decreased knowledge of use of DME;Decreased safety awareness;Decreased knowledge of precautions;Decreased cognition  PT Treatment Interventions Gait training;DME instruction;Functional mobility training;Therapeutic activities   PT Goals (Current goals can be found in the  Care Plan section) Acute Rehab PT Goals Patient Stated Goal: per wife, to go to rehab. PT Goal Formulation: With family Time For Goal Achievement: 10/09/13 Potential to Achieve Goals: Fair    Frequency Min 2X/week   Barriers to discharge Decreased caregiver support      Co-evaluation               End of Session Equipment Utilized During Treatment: Gait belt Activity Tolerance: Patient tolerated treatment well Patient left: in bed;with family/visitor present (2 rails up) Nurse Communication: Mobility status    Functional Assessment Tool Used: clinical judgement Functional Limitation: Mobility: Walking and moving around Mobility: Walking and Moving Around Current Status ([id number]): At least 40 percent but less than 60 percent impaired, limited or restricted Mobility: Walking and Moving Around Goal Status [phone](G8979): At least 1 percent but less than 20 percent impaired, limited or restricted    Time: 1545-1609 PT Time Calculation (min): 24 min   Charges:   PT Evaluation $Initial PT Evaluation Tier I: 1 Procedure PT Treatments $Gait Training: 23-37 mins   PT G Codes:   Functional Assessment Tool Used: clinical judgement Functional Limitation: Mobility: Walking and moving around    [place], Taeden Geller Elizabeth 09/25/2013, 4:29 PM

## 2013-09-25 NOTE — Clinical Social Work Note (Signed)
CSW contacted EDP and asked for PT eval to be ordered for level of care determination- Dr. Denton LankSteinl agreed to do this and we will await eval and recommendations- CSW has faxed patient out to SNF and ALF's and we are awaiting bed offers- will advise.  Reece LevyJanet Noela [name], MSW, Theresia MajorsLCSWA [phone]

## 2013-09-25 NOTE — Clinical Social Work Psychosocial (Signed)
Clinical Social Work Department BRIEF PSYCHOSOCIAL ASSESSMENT 09/25/2013  Patient:  Kurt [name], Kurt [name]     Account Number:  [id number]     Admit date:  09/25/2013  Clinical Social Worker:  Daiva Huge  Date/Time:  09/25/2013 01:37 PM  Referred by:  Physician  Date Referred:  09/25/2013 Referred for  SNF Placement   Other Referral:   Interview type:  Family Other interview type:   Wife at bedside along with her sister    PSYCHOSOCIAL DATA Living Status:  FAMILY Admitted from facility:   Level of care:   Primary support name:  Wife- [phone] Primary support relationship to patient:  FAMILY Degree of support available:   good but patients care needs are too high for home per wife    CURRENT CONCERNS Current Concerns  Post-Acute Placement   Other Concerns:    SOCIAL WORK ASSESSMENT / PLAN Met with wife and her sister at patients bedside- per wife, patient has become too high level for her to manage at home- "he falls and doesnt listen to me well". SHe reports paying out of pocket for the past 7 weeks for around the clock care- She is requesting placement in the [place] area-   Assessment/plan status:  Other - See comment Other assessment/ plan:   Will complete FL2 and PASARR for placement   Information/referral to community resources:   Discussed possible placement options- SNF and ALF and the likely need for  a memory care unit- She reports they have a LTC policy which will help cover placement (90 waiting period).    PATIENT'S/FAMILY'S RESPONSE TO PLAN OF CARE: Wife appears exhausted from caregiving and states, "my children tried to get me to do this 2 years ago".  Support provided- will proceed with placement options- will ask MD for PT eval for ?SNF/[employer] approval -vs- ALF.    Eduard Clos, MSW, [place]

## 2013-09-25 NOTE — ED Notes (Signed)
Bed: WA27 Expected date:  Expected time:  Means of arrival:  Comments: Hold for room 20 border

## 2013-09-25 NOTE — Progress Notes (Signed)
CSW spoke with Dr. Arizona ConstableStienl who confirms that the TTS psych consult is not needed but for social work to continue for safe discharge planning.     Kurt [name], MSW, [place], 09/25/2013 Evening Clinical Social Worker [phone]

## 2013-09-25 NOTE — ED Provider Notes (Signed)
CSN: [id number]     Arrival date & time 09/25/13  1022 History   First MD Initiated Contact with Patient 09/25/13 1041     Chief Complaint  Patient presents with  . Fall  . Dementia     (Consider location/radiation/quality/duration/timing/severity/associated sxs/prior Treatment) Patient is a 80 y.o. male presenting with fall. The history is provided by the patient and the spouse. The history is limited by the condition of the patient.  Fall  pt with hx advanced dementia x 3 years, presents from home, spouse indicating can no longer manage his behaviors at home, requests placement.  Also states urine seems very strong/odorous, and questions possible uti.  Pt w advanced dementia - not conversant at baseline w marked confusion - level 5 caveat.    Past Medical History  Diagnosis Date  . Hypertension   . Hyperlipidemia   . Renal stones     stones  . Stroke   . CAD (coronary artery disease)   . Dementia     after CVA  . Foot drop, left   . Dementia   . Complication of anesthesia     trouble waking up in childhood   Past Surgical History  Procedure Laterality Date  . Appendectomy    . Tonsillectomy and adenoidectomy  1960's  . 3 back surgeries  1980's  . Pacemaker insertion  2000  . 5 bypass  04/29/2005  . Stroke during bypass surgery 04/29/2005 and another 05/17/05  2007  . Total knee replacement, left  10/07  . Total knee replacement, right  2/09  . 2nd pacemaker, generator (battery) replaced  01/08/11   Family History  Problem Relation Age of Onset  . Cancer Brother     stomach  . Heart disease Other   . Heart disease Mother   . Heart disease Father   . Colon cancer Neg Hx   . Prostate cancer Neg Hx    History  Substance Use Topics  . Smoking status: Never Smoker   . Smokeless tobacco: Never Used  . Alcohol Use: No    Review of Systems  Unable to perform ROS: Dementia  level 5 caveat    Allergies  Review of patient's allergies indicates no known  allergies.  Home Medications   Prior to Admission medications   Medication Sig Start Date End Date Taking? Authorizing Provider  aspirin EC 81 MG tablet Take 1 tablet (81 mg total) by mouth daily. 05/29/13  Yes Mihai Croitoru, MD  sulfamethoxazole-trimethoprim (BACTRIM DS) 800-160 MG per tablet Take 1 tablet by mouth 2 (two) times daily. 07/27/13   Nicki Reaperegina Baity, NP   BP 147/133  Pulse 73  Resp 18  SpO2 97% Physical Exam  Nursing note and vitals reviewed. Constitutional: He appears well-developed and well-nourished. No distress.  HENT:  Head: Atraumatic.  Mouth/Throat: Oropharynx is clear and moist.  Eyes: Conjunctivae are normal. Pupils are equal, round, and reactive to light. No scleral icterus.  Neck: Neck supple. No tracheal deviation present. No thyromegaly present.  Cardiovascular: Normal rate, regular rhythm, normal heart sounds and intact distal pulses.   Pulmonary/Chest: Effort normal and breath sounds normal. No accessory muscle usage. No respiratory distress.  Abdominal: Soft. Bowel sounds are normal. He exhibits no distension. There is no tenderness.  Genitourinary:  No cva tenderness  Musculoskeletal: Normal range of motion. He exhibits no edema and no tenderness.  CTLS spine, non tender, aligned, no step off. Good rom bil ext without pain or focal bony tenderness.  Neurological: He is alert.  Awake and alert. Confused. Moves bil ext purposefully w good strength. Mental status c/w baseline per family.   Skin: Skin is warm and dry. No rash noted. He is not diaphoretic.  Psychiatric:  Alert, content appearing.     ED Course  Procedures (including critical care time) Labs Review  Results for orders placed during the hospital encounter of 09/25/13  CBC      Result Value Ref Range   WBC 5.5  4.0 - 10.5 K/uL   RBC 4.66  4.22 - 5.81 MIL/uL   Hemoglobin 15.0  13.0 - 17.0 g/dL   HCT 81.1  91.4 - 78.2 %   MCV 92.9  78.0 - 100.0 fL   MCH 32.2  26.0 - 34.0 pg   MCHC  34.6  30.0 - 36.0 g/dL   RDW 95.6  21.3 - 08.6 %   Platelets 196  150 - 400 K/uL  COMPREHENSIVE METABOLIC PANEL      Result Value Ref Range   Sodium 138  137 - 147 mEq/L   Potassium 4.3  3.7 - 5.3 mEq/L   Chloride 101  96 - 112 mEq/L   CO2 24  19 - 32 mEq/L   Glucose, Bld 95  70 - 99 mg/dL   BUN 9  6 - 23 mg/dL   Creatinine, Ser 5.78  0.50 - 1.35 mg/dL   Calcium 9.7  8.4 - 46.9 mg/dL   Total Protein 7.0  6.0 - 8.3 g/dL   Albumin 3.9  3.5 - 5.2 g/dL   AST 29  0 - 37 U/L   ALT 17  0 - 53 U/L   Alkaline Phosphatase 66  39 - 117 U/L   Total Bilirubin 1.6 (*) 0.3 - 1.2 mg/dL   GFR calc non Af Amer 80 (*) >90 mL/min   GFR calc Af Amer >90  >90 mL/min   Anion gap 13  5 - 15  URINALYSIS, ROUTINE W REFLEX MICROSCOPIC      Result Value Ref Range   Color, Urine RED (*) YELLOW   APPearance TURBID (*) CLEAR   Specific Gravity, Urine 1.020  1.005 - 1.030   pH 5.0  5.0 - 8.0   Glucose, UA NEGATIVE  NEGATIVE mg/dL   Hgb urine dipstick LARGE (*) NEGATIVE   Bilirubin Urine MODERATE (*) NEGATIVE   Ketones, ur NEGATIVE  NEGATIVE mg/dL   Protein, ur 629 (*) NEGATIVE mg/dL   Urobilinogen, UA 1.0  0.0 - 1.0 mg/dL   Nitrite NEGATIVE  NEGATIVE   Leukocytes, UA SMALL (*) NEGATIVE  URINE MICROSCOPIC-ADD ON      Result Value Ref Range   RBC / HPF TOO NUMEROUS TO COUNT  <3 RBC/hpf   Urine-Other FIELD OBSCURED BY RBC'S         MDM  Labs.  sw consulted.  Reviewed nursing notes and prior charts for additional history.   Possible uti on labs.  u culture sent.  Keflex po.   sw requests PT consult, indicating they are optimistic about being able to place pt expeditiously from ED.  PT consulted.  SW assessment/placement remains pending.   Will sign out to oncoming provider to f/u with SW eval/ecf placement/disposition.     Suzi Roots, MD 09/25/13 1434

## 2013-09-25 NOTE — ED Notes (Signed)
PT at bedside.

## 2013-09-26 LAB — URINE CULTURE
COLONY COUNT: NO GROWTH
CULTURE: NO GROWTH

## 2013-09-26 MED ORDER — TUBERCULIN PPD 5 UNIT/0.1ML ID SOLN
5.0000 [IU] | Freq: Once | INTRADERMAL | Status: DC
  Administered 2013-09-26: 5 [IU] via INTRADERMAL
  Filled 2013-09-26: qty 0.1

## 2013-09-26 NOTE — Progress Notes (Signed)
CSW spoke with [facility] [place] [place] who was able to assess the patient for admission and accepted him for placement.  She will need the patient original FL-2, current medication list that includes that adminstered PPD test, and physical therapy orders.  She reports that the patient can be admitted on 09/27/2013 after 7am.  Patient's wife was at bedside and is well informed of the her role in the placement process.  Maryelizabeth Rowanressa Caoimhe Damron, MSW, Dawn[place]CSWA, 09/26/2013 Evening Clinical Social Worker [phone]

## 2013-09-26 NOTE — ED Notes (Signed)
Bed: [id number] Expected date:  Expected time:  Means of arrival:  Comments: RM 27

## 2013-09-26 NOTE — ED Notes (Signed)
Pt has sister-in-law at bedside

## 2013-09-26 NOTE — ED Notes (Signed)
Nursing Home Representative at bedside with pt's family.

## 2013-09-26 NOTE — ED Notes (Signed)
Report given to Lauren.  Pt transferred from room 27 to 16.

## 2013-09-26 NOTE — Progress Notes (Signed)
CSW provided the wife with contact information on the facilities that are willing to accept this patient but she is requesting [facility] [place] because is closer to the family.  [facility] and she will come to assess the patient for placement.  CSW informed the family of the facility representative coming to complete an assessment and the estimated cost of care.       Maryelizabeth Rowanressa Carmeline Kowal, MSW, [place]CSWA, 09/26/2013 Evening Clinical Social Worker [phone]

## 2013-09-26 NOTE — Progress Notes (Signed)
CSW faxed referrals out for placement and now awaiting response.     Maryelizabeth Rowanressa Jaquayla Hege, MSW, [place]CS[place], 09/26/2013 Evening Clinical Social Worker [phone]

## 2013-09-26 NOTE — ED Notes (Signed)
Pt eating breakfast 

## 2013-09-26 NOTE — ED Notes (Signed)
[facility] EDP notified re pt's low BP.  Gave order to move pt and try to ambulate him.  Attempted to ambulate pt, pt unable to sit up on his own with assist from this staff or other nurse.  However, BP improved after moving pt.

## 2013-09-27 MED ORDER — TUBERCULIN PPD 5 UNIT/0.1ML ID SOLN: 5.0000 [IU] | Freq: Once | INTRADERMAL | Status: DC

## 2013-09-27 NOTE — Discharge Instructions (Signed)
Dementia °Dementia is a word that is used to describe problems with the brain and how it works. People with dementia have memory loss. They may also have problems with thinking, speaking, or solving problems. It can affect how they act around people, how they do their job, their mood, and their personality. These changes may not show up for a long time. Family or friends may not notice problems in the early part of this disease. °HOME CARE °The following tips are for the person living with, or caring for, the person with dementia. °Make the home safe. °· Remove locks on bathroom doors. °· Use childproof locks on cabinets where alcohol, cleaning supplies, or chemicals are stored. °· Put outlet covers in electrical outlets. °· Put in childproof locks to keep doors and windows safe. °· Remove stove knobs, or put in safety knobs that shut off on their own. °· Lower the temperature on water heaters. °· Label medicines. Lock them in a safe place. °· Keep knives, lighters, matches, power tools, and guns out of reach or in a safe place. °· Remove objects that might break or can hurt the person. °· Make sure lighting is good inside and outside. °· Put in grab bars if needed. °· Use a device that detects falls or other needs for help. °Lessen confusion. °· Keep familiar objects and people around. °· Use night lights or low lit (dim) lights at night. °· Label objects or areas. °· Use reminders, notes, or directions for daily activities or tasks. °· Keep a simple routine that is the same for waking, meals, bathing, dressing, and bedtime. °· Create a calm and quiet home. °· Put up clocks and calendars. °· Keep emergency numbers and the home address near all phones. °· Help show the different times of day. Open the curtains during the day to let light in. °Speak clearly and directly. °· Choose simple words and short sentences. °· Use a gentle, calm voice. °· Do not interrupt. °· If the person has a hard time finding a word to  use, give them the word or thought. °· Ask 1 question at a time. Give enough time for the person to answer. Repeat the question if the person does not answer. °Do things that lessen restlessness. °· Provide a comfortable bed. °· Have the same bedtime routine every night. °· Have a regular walking and activity schedule. °· Lessen naps during the day. °· Do not let the person drink a lot of caffeine. °· Go to events that are not overwhelming. °Eat well and drink fluids. °· Lessen distractions during meal times and snacks. °· Avoid foods that are too hot or too cold. °· Watch how the person chews and swallows. This is to make sure they do not choke. °Other °· Keep all vision, hearing, dental, and medical visits with the doctor. °· Only give medicines as told by the doctor. °· Watch the person's driving ability. Do not let the person drive if he or she cannot drive safely. °· Use a program that helps find a person if they become missing. You may need to register with this program. °GET HELP RIGHT AWAY IF:  °· A fever of 102° F (38.9° C) develops. °· Confusion develops or gets worse. °· Sleepiness develops or gets worse. °· Staying awake is hard to do. °· New behavior problems start like mood swings, aggression, and seeing things that are not there. °· Problems with balance, speech, or falling develop. °· Problems swallowing develop. °· Any   problems of another sickness develop. °MAKE SURE YOU: °· Understand these instructions. °· Will watch his or her condition. °· Will get help right away if he or she is not doing well or gets worse. °Document Released: 01/29/2008 Document Revised: 05/10/2011 Document Reviewed: 07/13/2010 °ExitCare® Patient Information ©2015 [employer]. This information is not intended to replace advice given to you by your health care provider. Make sure you discuss any questions you have with your health care provider. ° °

## 2013-09-27 NOTE — Progress Notes (Addendum)
When pt is cleared for d/c, please call report [facility] [place] at [phone].

## 2013-09-27 NOTE — ED Provider Notes (Signed)
57M here with advanced dementia. Awaiting placement. Had PPD placed last night. Urine culture negative, can stop Keflex at discharge. Had one BP of 91/51, 5 minutes later, 160s systolic with persistently normal BPs, likely mechanical issue rather than actual hypotension.  Stable for discharge.  1. Dementia, without behavioral disturbance      Dagmar HaitWilliam Kourtlyn Charlet, MD 09/27/13 [phone]

## 2013-09-28 ENCOUNTER — Telehealth: Payer: Self-pay

## 2013-09-28 NOTE — Telephone Encounter (Signed)
Nettie ElmSylvia left v/m requesting cb; pt is at [facility]; Nettie ElmSylvia said pt is not getting all the medication he is supposed to have at [facility]; Nettie ElmSylvia said [facility] told her that they have contacted Dr Para Marchuncan about med order. Pt also having problems with UTI, voiding in bed, has fallen out of bed. Spoke with Carlena SaxBlair, nurse at [facility]; [phone]. Blair faxed FL 2 and addendum and verification about Celexa 40 mg daily on 09/27/13. Dr Para Marchuncan filled out form today and is being faxed to [facility]. Spoke with Nettie ElmSylvia and was advised about medication; that satisfied Mrs. Bramblett. The issue with the pt falling out of bed and UTI was a previous problem prior to pt being seen [facility]. Nettie ElmSylvia said she will speak with nurse at [facility] later today.

## 2013-10-02 ENCOUNTER — Telehealth: Payer: Self-pay

## 2013-10-02 NOTE — Telephone Encounter (Signed)
Charlynne PanderCara says he is going to continue to follow up with Dr. Para Marchuncan.  Verbal order given for OT/PT evaluation.  Kurt [name] asked for a copy of the patient's last OV in June to be faxed.  Note faxed.

## 2013-10-02 NOTE — Telephone Encounter (Signed)
If he is going to f/u here, then okay for us to give the OT/PT eval order. If he is going to have MD there following him, then they need to order it.  Thanks.

## 2013-10-02 NOTE — Telephone Encounter (Signed)
Charlynne Panderara nurse with [facility] left v/m; pt was discharged from hospital and referral was given for OT and PT. Charlynne PanderCara wants confirmation from Dr Para Marchuncan that it is OK to do OT and PT evaluation and request verbal order or can fax orders  fax # [phone]. Charlynne PanderCara has received signed FL2 dated 09/27/13 Charlynne Pander(Cara cannot verify physicians signature.)

## 2013-10-04 ENCOUNTER — Telehealth: Payer: Self-pay

## 2013-10-04 NOTE — Telephone Encounter (Signed)
Cordelia PenSherry (nurse at [facility]) advised.

## 2013-10-04 NOTE — Telephone Encounter (Signed)
If there is a concern for cellulitis, then he needs eval for consideration of ABX. I wouldn't put on the boot w/o eval first.

## 2013-10-04 NOTE — Telephone Encounter (Signed)
Cordelia PenSherry nurse with [facility] left v/m requesting verbal order; pt has 2 + edema both lower legs and possible cellulitis. Cordelia PenSherry wants to know if Dr Para Marchuncan would like Roland RackUnna boot; pt has small but multiple areas on leg that may be breaking down, appear beginning of small blisters.Sherry request cb.

## 2013-10-05 ENCOUNTER — Ambulatory Visit (INDEPENDENT_AMBULATORY_CARE_PROVIDER_SITE_OTHER): Payer: Commercial Managed Care - HMO | Admitting: Family Medicine

## 2013-10-05 ENCOUNTER — Encounter: Payer: Self-pay | Admitting: Family Medicine

## 2013-10-05 VITALS — BP 130/60 | HR 75 | Temp 97.5°F

## 2013-10-05 DIAGNOSIS — R609 Edema, unspecified: Secondary | ICD-10-CM

## 2013-10-05 DIAGNOSIS — G4700 Insomnia, unspecified: Secondary | ICD-10-CM

## 2013-10-05 NOTE — Patient Instructions (Signed)
Use 10mmHg compression stocking, knee high, daily.  Remove at night.  Restart melatonin 3mg  by mouth at night.  Take care.

## 2013-10-05 NOTE — Progress Notes (Signed)
Pre visit review using our clinic review tool, if applicable. No additional management support is needed unless otherwise documented below in the visit note.  Concern for leg wound and edema.  Here for eval.  No venous ulcers.  No fevers.  Still with BLE edema.  Had scraped his R shin prev.  Already on septra.   Rarely following simple commands now.  In wheelchair.   Sleep disrupted recently, had been off melatonin in the meantime, had likely helped some prev.   Meds, vitals, and allergies reviewed.   ROS: See HPI.  Otherwise, noncontributory.  Nad, in wheelchair.  Will say a few words, can't follow conversation.  Will occ follow a command, "pick up your foot" but not consistently.   Mmm rrr ctab abd soft Ext with 1+ BLE edema, linear pattern of scabbed lesions on the R anterior shin, not ttp, none appear to be infected.

## 2013-10-07 DIAGNOSIS — G4700 Insomnia, unspecified: Secondary | ICD-10-CM | POA: Insufficient documentation

## 2013-10-07 DIAGNOSIS — R609 Edema, unspecified: Secondary | ICD-10-CM | POA: Insufficient documentation

## 2013-10-07 NOTE — Assessment & Plan Note (Signed)
Restart melatonin.

## 2013-10-07 NOTE — Assessment & Plan Note (Signed)
Will try to have staff elevated legs, use compression stockings daily, remove at night.  Doesn't need an unna boot as he doesn't have venous ulcers.  Doesn't appear to need broader abx.

## 2013-11-19 ENCOUNTER — Ambulatory Visit: Payer: Commercial Managed Care - HMO | Admitting: Nurse Practitioner

## 2013-12-08 IMAGING — CT CT HEAD W/O CM
2 series · 16 of 30 positions shown, 20 images · non-contrast
Comparison: CT 02/02/2011

CLINICAL DATA: Severe headache.  Dementia.

CT HEAD WITHOUT CONTRAST
TECHNIQUE: Contiguous axial images were obtained from the base of
the skull through the vertex without contrast.

[Series 2: bone windows · axial · 0.43mm/px · z∈[+1768,+1808]mm · 3 of 31 slices shown]
[im 3/31  bone]
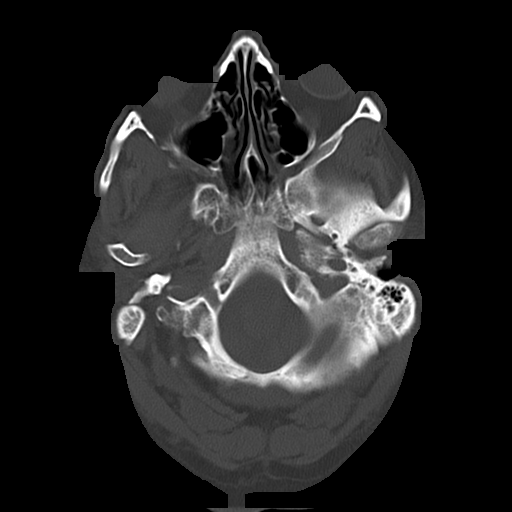
[im 7/31  bone]
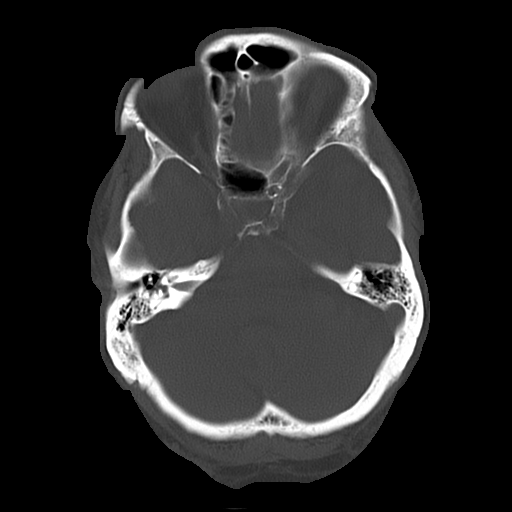
[im 11/31  bone]
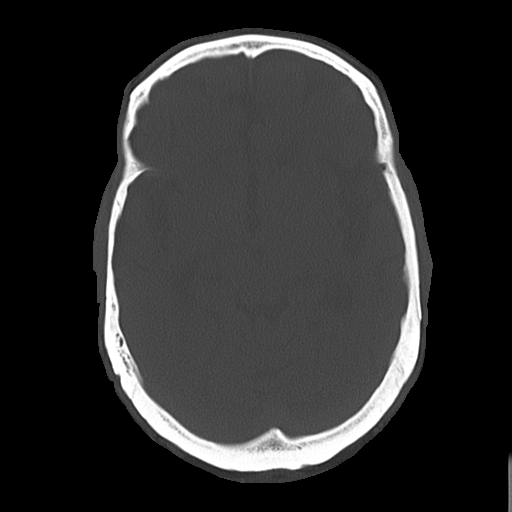

[Series 3: head w/o · axial · non-contrast · 0.43mm/px · z∈[+1768,+1893]mm · 13 of 31 slices shown, 17 images]
[im 3/31  brain]
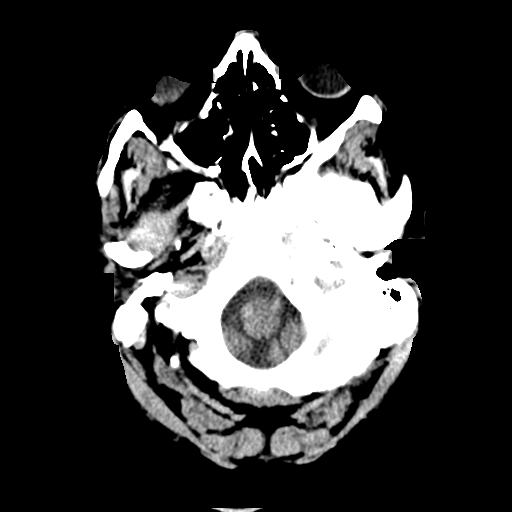
[im 3/31  bone]
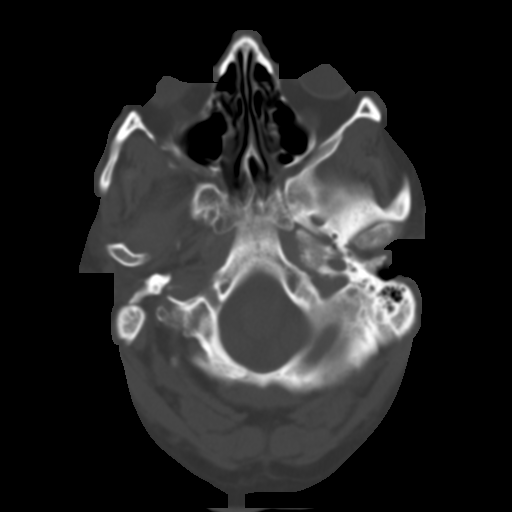
[im 5/31  brain]
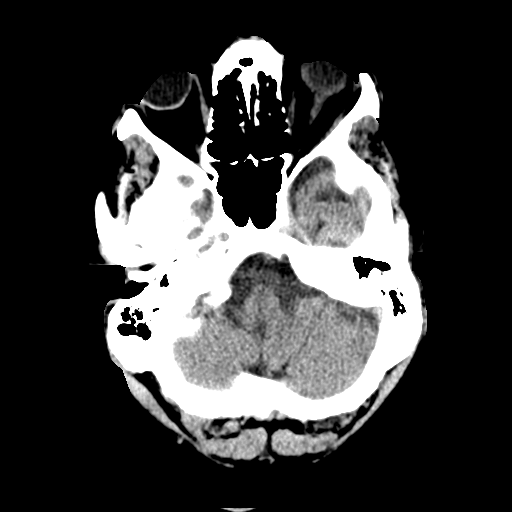
[im 7/31  brain]
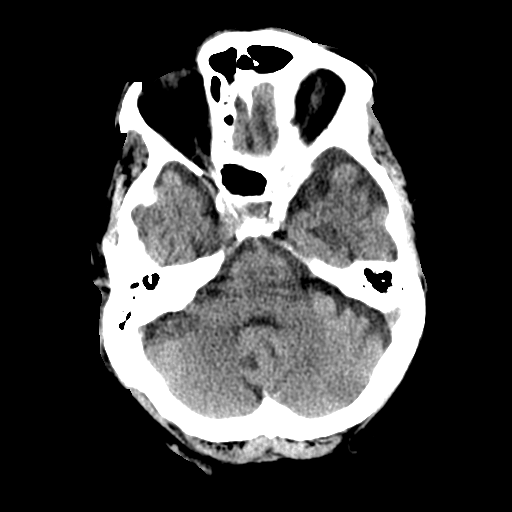
[im 9/31  brain]
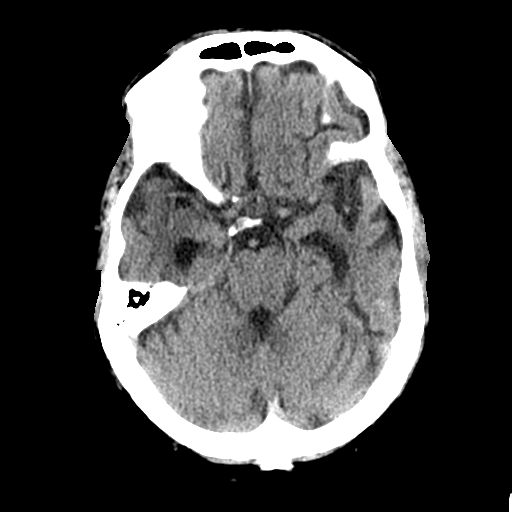
[im 11/31  brain]
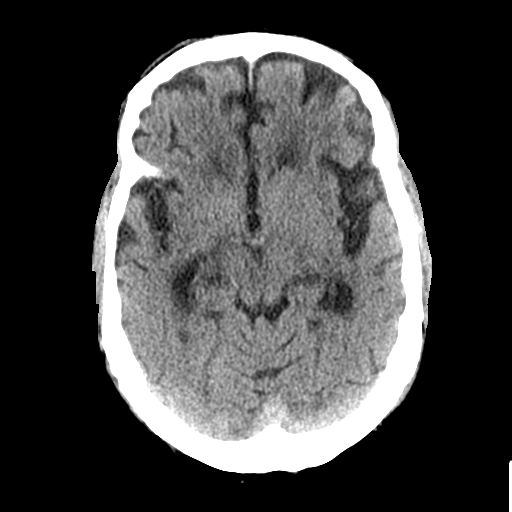
[im 11/31  bone]
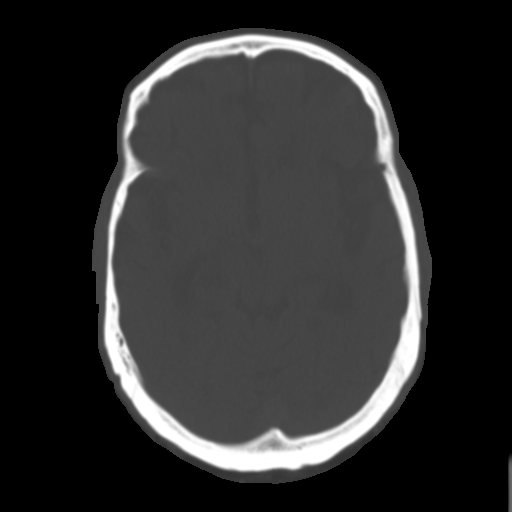
[im 13/31  brain]
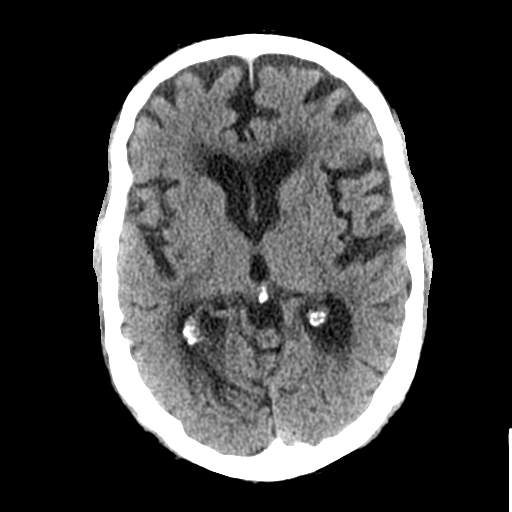
[im 16/31  brain]
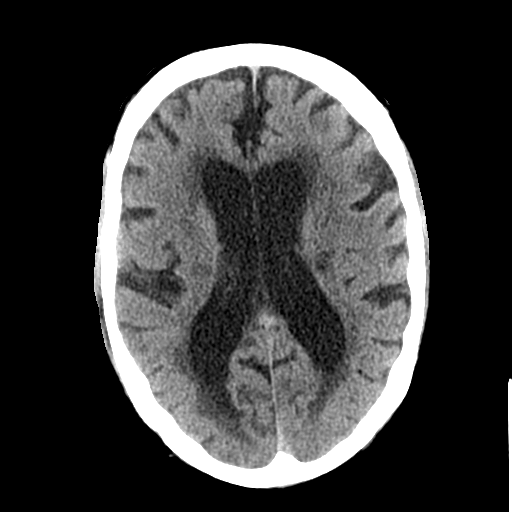
[im 18/31  brain]
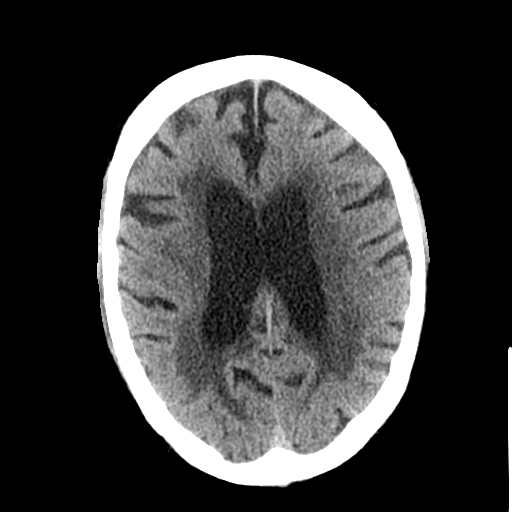
[im 20/31  brain]
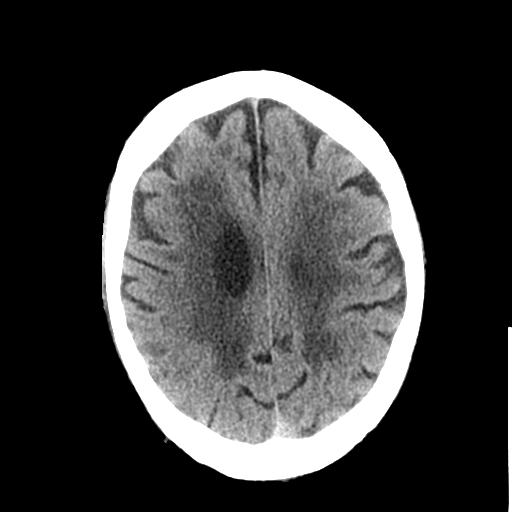
[im 20/31  bone]
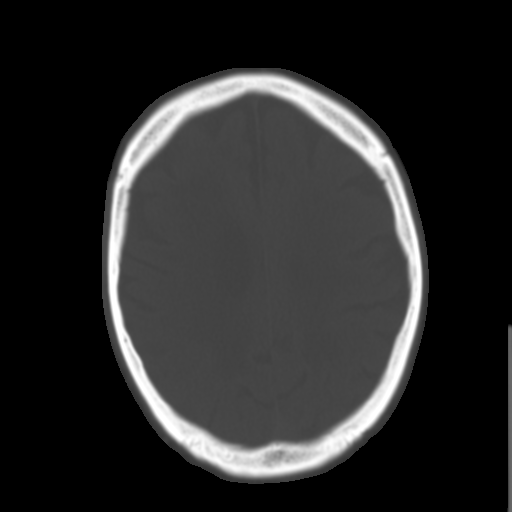
[im 22/31  brain]
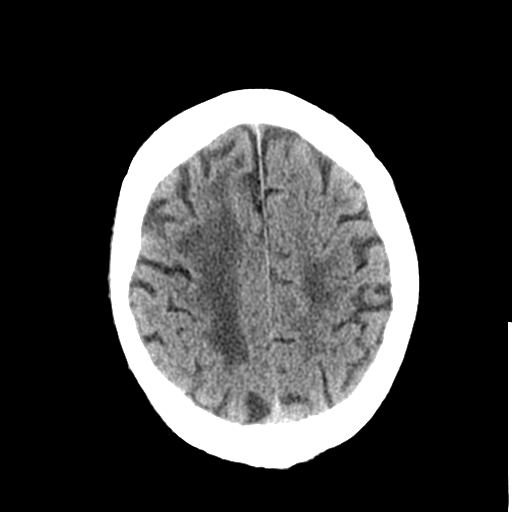
[im 24/31  brain]
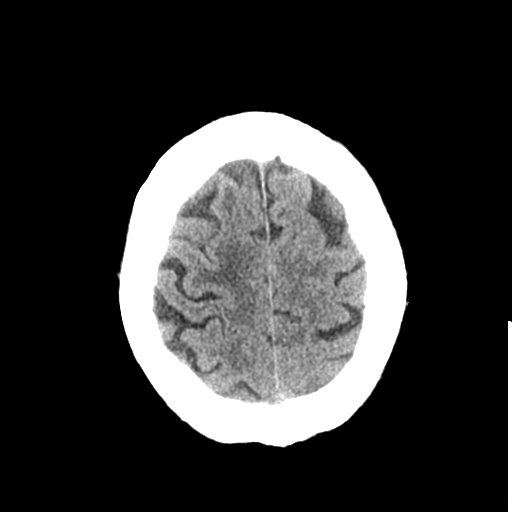
[im 26/31  brain]
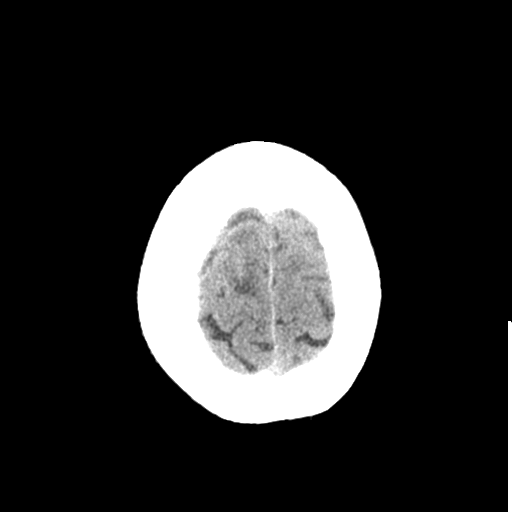
[im 28/31  brain]
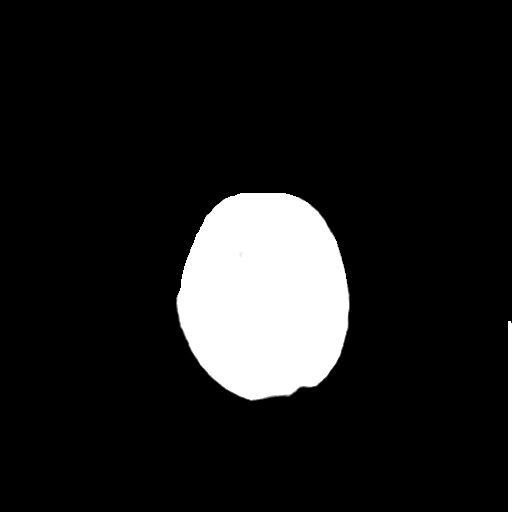
[im 28/31  bone]
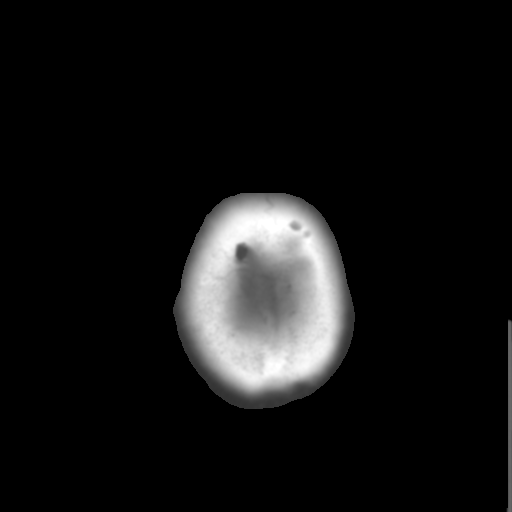

[16 of 30 positions shown; findings below may reference images not displayed]

FINDINGS: Moderate to advanced atrophy.  Moderate to advanced
chronic microvascular ischemic change in the white matter which is
similar to the prior CT.  Negative for acute infarct, hemorrhage,
or mass lesion.  No edema or shift of the midline structures.  No
skull lesion.  Mucosal edema in the right maxillary sinus.
IMPRESSION: Moderate to advanced atrophy and chronic microvascular ischemia.
No superimposed acute abnormality.

## 2014-01-08 ENCOUNTER — Emergency Department: Payer: Self-pay | Admitting: Emergency Medicine

## 2014-02-07 ENCOUNTER — Encounter (HOSPITAL_COMMUNITY): Payer: Self-pay | Admitting: Cardiovascular Disease

## 2014-05-01 ENCOUNTER — Encounter: Payer: Self-pay | Admitting: *Deleted

## 2014-05-08 ENCOUNTER — Telehealth: Payer: Self-pay | Admitting: Cardiovascular Disease

## 2014-05-08 NOTE — Telephone Encounter (Signed)
New Message        Pt's wife calling stating they received a letter from the device clinic about pt's device needing to be checked. Please call back and advise.

## 2014-05-08 NOTE — Telephone Encounter (Signed)
Spoke w/ pt wife and informed her that he was due to see MD in March. She stated that pt is unable to come into office unless he came by ambulance. Ordered pt a wirex and once it is received pt wife will send transmission.

## 2014-05-20 ENCOUNTER — Ambulatory Visit (INDEPENDENT_AMBULATORY_CARE_PROVIDER_SITE_OTHER): Payer: Commercial Managed Care - HMO | Admitting: *Deleted

## 2014-05-20 ENCOUNTER — Telehealth: Payer: Self-pay | Admitting: Cardiovascular Disease

## 2014-05-20 DIAGNOSIS — I442 Atrioventricular block, complete: Secondary | ICD-10-CM | POA: Diagnosis not present

## 2014-05-20 LAB — MDC_IDC_ENUM_SESS_TYPE_REMOTE
Battery Impedance: 254 Ohm
Battery Voltage: 2.77 V
Brady Statistic AP VP Percent: 48 %
Brady Statistic AP VS Percent: 0 %
Brady Statistic AS VP Percent: 52 %
Brady Statistic AS VS Percent: 0 %
Lead Channel Impedance Value: 388 Ohm
Lead Channel Pacing Threshold Amplitude: 0.375 V
Lead Channel Pacing Threshold Amplitude: 1.125 V
Lead Channel Pacing Threshold Pulse Width: 0.4 ms
Lead Channel Setting Pacing Amplitude: 1.5 V
Lead Channel Setting Pacing Pulse Width: 0.64 ms
MDC IDC MSMT BATTERY REMAINING LONGEVITY: 92 mo
MDC IDC MSMT LEADCHNL RA SENSING INTR AMPL: 2.8 mV
MDC IDC MSMT LEADCHNL RV IMPEDANCE VALUE: 564 Ohm
MDC IDC MSMT LEADCHNL RV PACING THRESHOLD PULSEWIDTH: 0.4 ms
MDC IDC SESS DTM: 20160321182741
MDC IDC SET LEADCHNL RV PACING AMPLITUDE: 2.5 V
MDC IDC SET LEADCHNL RV SENSING SENSITIVITY: 2.8 mV

## 2014-05-20 NOTE — Telephone Encounter (Signed)
She wants to know if you received his pacemaker transmission?

## 2014-05-24 NOTE — Progress Notes (Signed)
Remote pacemaker transmission.   

## 2014-05-29 NOTE — Telephone Encounter (Signed)
Wife informed that remote was received. Wife voiced understanding.

## 2014-05-30 ENCOUNTER — Encounter: Payer: Self-pay | Admitting: Cardiology

## 2014-06-04 ENCOUNTER — Encounter: Payer: Self-pay | Admitting: *Deleted

## 2014-06-07 ENCOUNTER — Encounter: Payer: Self-pay | Admitting: Cardiovascular Disease

## 2014-07-03 ENCOUNTER — Encounter: Payer: Self-pay | Admitting: *Deleted

## 2014-07-12 ENCOUNTER — Telehealth: Payer: Self-pay | Admitting: *Deleted

## 2014-07-12 NOTE — Telephone Encounter (Signed)
Fax received stating that Benztropine Mesylate 0.5 mg tablet is not covered under the patient's insurance plan.  Alternative medication is listed as Amantadine 100 mg capsule.  Take one capsule by mouth twice daily.  Form in Dr. Lianne Bushyuncan's In Box.  Please advise.

## 2014-07-16 NOTE — Telephone Encounter (Signed)
Left message on patient's voicemail to return call

## 2014-07-16 NOTE — Telephone Encounter (Signed)
I was looking back at this.  I don't see where I've written for Benztropine.  I need his full med list and need to know who rx'd the last rx, especially since I haven't seen him in about 10 months.  I held onto the form.   Thanks.

## 2014-07-17 ENCOUNTER — Telehealth: Payer: Self-pay | Admitting: Family Medicine

## 2014-07-17 NOTE — Telephone Encounter (Signed)
Noted, thanks.  I'll defer to them.

## 2014-07-17 NOTE — Telephone Encounter (Signed)
Patient's wife returned Lugene's call.  Please call Nettie ElmSylvia back.  [facility].

## 2014-07-17 NOTE — Telephone Encounter (Signed)
Returned phone call and message was taken care of. See other phone note.

## 2014-07-17 NOTE — Telephone Encounter (Signed)
Spoke to patient's wife and was advised that [facility] [address]. Was advised that patient is currently being followed by the doctors at that facility. Ms. Elby ShowersSockwell stated that she thinks that this medication was ordered by one of the doctor's at the facility and should be sent to them.

## 2014-08-19 ENCOUNTER — Emergency Department (HOSPITAL_COMMUNITY): Payer: Commercial Managed Care - HMO

## 2014-08-19 ENCOUNTER — Encounter (HOSPITAL_COMMUNITY): Payer: Self-pay | Admitting: Vascular Surgery

## 2014-08-19 ENCOUNTER — Inpatient Hospital Stay (HOSPITAL_COMMUNITY)
Admission: EM | Admit: 2014-08-19 | Discharge: 2014-08-23 | DRG: 871 | Disposition: A | Discharge status: Hospice | Payer: Commercial Managed Care - HMO | Attending: Internal Medicine | Admitting: Internal Medicine

## 2014-08-19 DIAGNOSIS — Z515 Encounter for palliative care: Secondary | ICD-10-CM

## 2014-08-19 DIAGNOSIS — Y95 Nosocomial condition: Secondary | ICD-10-CM | POA: Diagnosis present

## 2014-08-19 DIAGNOSIS — I6789 Other cerebrovascular disease: Secondary | ICD-10-CM | POA: Diagnosis not present

## 2014-08-19 DIAGNOSIS — G4730 Sleep apnea, unspecified: Secondary | ICD-10-CM | POA: Diagnosis present

## 2014-08-19 DIAGNOSIS — I10 Essential (primary) hypertension: Secondary | ICD-10-CM | POA: Diagnosis present

## 2014-08-19 DIAGNOSIS — F0150 Vascular dementia without behavioral disturbance: Secondary | ICD-10-CM | POA: Diagnosis present

## 2014-08-19 DIAGNOSIS — L89619 Pressure ulcer of right heel, unspecified stage: Secondary | ICD-10-CM | POA: Diagnosis present

## 2014-08-19 DIAGNOSIS — K219 Gastro-esophageal reflux disease without esophagitis: Secondary | ICD-10-CM | POA: Diagnosis present

## 2014-08-19 DIAGNOSIS — Z66 Do not resuscitate: Secondary | ICD-10-CM | POA: Diagnosis present

## 2014-08-19 DIAGNOSIS — F329 Major depressive disorder, single episode, unspecified: Secondary | ICD-10-CM | POA: Diagnosis present

## 2014-08-19 DIAGNOSIS — L89629 Pressure ulcer of left heel, unspecified stage: Secondary | ICD-10-CM | POA: Diagnosis present

## 2014-08-19 DIAGNOSIS — R6521 Severe sepsis with septic shock: Secondary | ICD-10-CM | POA: Diagnosis present

## 2014-08-19 DIAGNOSIS — Z96653 Presence of artificial knee joint, bilateral: Secondary | ICD-10-CM | POA: Diagnosis present

## 2014-08-19 DIAGNOSIS — L97529 Non-pressure chronic ulcer of other part of left foot with unspecified severity: Secondary | ICD-10-CM

## 2014-08-19 DIAGNOSIS — Z79899 Other long term (current) drug therapy: Secondary | ICD-10-CM

## 2014-08-19 DIAGNOSIS — L8990 Pressure ulcer of unspecified site, unspecified stage: Secondary | ICD-10-CM | POA: Insufficient documentation

## 2014-08-19 DIAGNOSIS — I442 Atrioventricular block, complete: Secondary | ICD-10-CM | POA: Diagnosis present

## 2014-08-19 DIAGNOSIS — I2510 Atherosclerotic heart disease of native coronary artery without angina pectoris: Secondary | ICD-10-CM | POA: Diagnosis present

## 2014-08-19 DIAGNOSIS — J189 Pneumonia, unspecified organism: Secondary | ICD-10-CM | POA: Insufficient documentation

## 2014-08-19 DIAGNOSIS — Z7982 Long term (current) use of aspirin: Secondary | ICD-10-CM

## 2014-08-19 DIAGNOSIS — Z8673 Personal history of transient ischemic attack (TIA), and cerebral infarction without residual deficits: Secondary | ICD-10-CM | POA: Diagnosis not present

## 2014-08-19 DIAGNOSIS — A419 Sepsis, unspecified organism: Secondary | ICD-10-CM | POA: Diagnosis not present

## 2014-08-19 DIAGNOSIS — E785 Hyperlipidemia, unspecified: Secondary | ICD-10-CM | POA: Diagnosis present

## 2014-08-19 DIAGNOSIS — Z950 Presence of cardiac pacemaker: Secondary | ICD-10-CM | POA: Diagnosis present

## 2014-08-19 DIAGNOSIS — L97519 Non-pressure chronic ulcer of other part of right foot with unspecified severity: Secondary | ICD-10-CM

## 2014-08-19 DIAGNOSIS — R4182 Altered mental status, unspecified: Secondary | ICD-10-CM | POA: Diagnosis present

## 2014-08-19 DIAGNOSIS — J690 Pneumonitis due to inhalation of food and vomit: Secondary | ICD-10-CM | POA: Diagnosis not present

## 2014-08-19 DIAGNOSIS — E782 Mixed hyperlipidemia: Secondary | ICD-10-CM | POA: Diagnosis present

## 2014-08-19 LAB — CBC WITH DIFFERENTIAL/PLATELET
BASOS ABS: 0 10*3/uL (ref 0.0–0.1)
BASOS PCT: 0 % (ref 0–1)
Eosinophils Absolute: 0 10*3/uL (ref 0.0–0.7)
Eosinophils Relative: 0 % (ref 0–5)
HCT: 46.1 % (ref 39.0–52.0)
HEMOGLOBIN: 15.2 g/dL (ref 13.0–17.0)
Lymphocytes Relative: 3 % — ABNORMAL LOW (ref 12–46)
Lymphs Abs: 0.6 10*3/uL — ABNORMAL LOW (ref 0.7–4.0)
MCH: 32.1 pg (ref 26.0–34.0)
MCHC: 33 g/dL (ref 30.0–36.0)
MCV: 97.5 fL (ref 78.0–100.0)
MONOS PCT: 5 % (ref 3–12)
Monocytes Absolute: 1 10*3/uL (ref 0.1–1.0)
NEUTROS ABS: 19.4 10*3/uL — AB (ref 1.7–7.7)
NEUTROS PCT: 92 % — AB (ref 43–77)
PLATELETS: 329 10*3/uL (ref 150–400)
RBC: 4.73 MIL/uL (ref 4.22–5.81)
RDW: 14.3 % (ref 11.5–15.5)
WBC: 21 10*3/uL — ABNORMAL HIGH (ref 4.0–10.5)

## 2014-08-19 LAB — COMPREHENSIVE METABOLIC PANEL
ALT: 32 U/L (ref 17–63)
AST: 49 U/L — ABNORMAL HIGH (ref 15–41)
Albumin: 2.3 g/dL — ABNORMAL LOW (ref 3.5–5.0)
Alkaline Phosphatase: 42 U/L (ref 38–126)
Anion gap: 8 (ref 5–15)
BUN: 49 mg/dL — AB (ref 6–20)
CHLORIDE: 125 mmol/L — AB (ref 101–111)
CO2: 22 mmol/L (ref 22–32)
CREATININE: 1.54 mg/dL — AB (ref 0.61–1.24)
Calcium: 8.8 mg/dL — ABNORMAL LOW (ref 8.9–10.3)
GFR, EST AFRICAN AMERICAN: 47 mL/min — AB (ref >60)
GFR, EST NON AFRICAN AMERICAN: 41 mL/min — AB (ref >60)
Glucose, Bld: 122 mg/dL — ABNORMAL HIGH (ref 65–99)
Potassium: 4.4 mmol/L (ref 3.5–5.1)
Sodium: 155 mmol/L — ABNORMAL HIGH (ref 135–145)
Total Bilirubin: 2 mg/dL — ABNORMAL HIGH (ref 0.3–1.2)
Total Protein: 6.2 g/dL — ABNORMAL LOW (ref 6.5–8.1)

## 2014-08-19 LAB — I-STAT CG4 LACTIC ACID, ED: LACTIC ACID, VENOUS: 2.83 mmol/L — AB (ref 0.5–2.0)

## 2014-08-19 MED ORDER — LORAZEPAM 2 MG/ML IJ SOLN: 0.5000 mg | INTRAMUSCULAR | Status: DC | PRN

## 2014-08-19 MED ORDER — VANCOMYCIN HCL 10 G IV SOLR
1500.0000 mg | Freq: Once | INTRAVENOUS | Status: DC
  Administered 2014-08-19: 1500 mg via INTRAVENOUS
  Filled 2014-08-19: qty 1500

## 2014-08-19 MED ORDER — MORPHINE SULFATE 2 MG/ML IJ SOLN
1.0000 mg | INTRAMUSCULAR | Status: DC | PRN
  Administered 2014-08-19 – 2014-08-21 (×8): 2 mg via INTRAVENOUS
  Administered 2014-08-22: 1 mg via INTRAVENOUS
  Administered 2014-08-22 – 2014-08-23 (×7): 2 mg via INTRAVENOUS
  Filled 2014-08-19 (×17): qty 1

## 2014-08-19 MED ORDER — SODIUM CHLORIDE 0.9 % IV BOLUS (SEPSIS)
1000.0000 mL | Freq: Once | INTRAVENOUS | Status: AC
  Administered 2014-08-19: 1000 mL via INTRAVENOUS

## 2014-08-19 MED ORDER — CHLORHEXIDINE GLUCONATE 0.12 % MT SOLN
15.0000 mL | Freq: Two times a day (BID) | OROMUCOSAL | Status: DC
  Administered 2014-08-19 – 2014-08-23 (×6): 15 mL via OROMUCOSAL
  Filled 2014-08-19 (×3): qty 15

## 2014-08-19 MED ORDER — LORAZEPAM 2 MG/ML IJ SOLN: 1.0000 mg | INTRAMUSCULAR | Status: DC | PRN

## 2014-08-19 MED ORDER — ONDANSETRON HCL 4 MG PO TABS: 4.0000 mg | ORAL_TABLET | Freq: Four times a day (QID) | ORAL | Status: DC | PRN

## 2014-08-19 MED ORDER — SODIUM CHLORIDE 0.9 % IV BOLUS (SEPSIS): 1000.0000 mL | Freq: Once | INTRAVENOUS | Status: DC

## 2014-08-19 MED ORDER — PIPERACILLIN-TAZOBACTAM 3.375 G IVPB 30 MIN
3.3750 g | Freq: Once | INTRAVENOUS | Status: AC
  Administered 2014-08-19: 3.375 g via INTRAVENOUS
  Filled 2014-08-19: qty 50

## 2014-08-19 MED ORDER — MORPHINE SULFATE 2 MG/ML IJ SOLN
1.0000 mg | INTRAMUSCULAR | Status: DC | PRN
  Administered 2014-08-19: 1 mg via INTRAVENOUS
  Filled 2014-08-19: qty 1

## 2014-08-19 MED ORDER — SODIUM CHLORIDE 0.9 % IV BOLUS (SEPSIS)
1000.0000 mL | Freq: Once | INTRAVENOUS | Status: DC
Start: 2014-08-19 — End: 2014-08-19

## 2014-08-19 MED ORDER — ONDANSETRON HCL 4 MG/2ML IJ SOLN: 4.0000 mg | Freq: Four times a day (QID) | INTRAMUSCULAR | Status: DC | PRN

## 2014-08-19 MED ORDER — CETYLPYRIDINIUM CHLORIDE 0.05 % MT LIQD
7.0000 mL | Freq: Two times a day (BID) | OROMUCOSAL | Status: DC
  Administered 2014-08-20 – 2014-08-23 (×4): 7 mL via OROMUCOSAL

## 2014-08-19 MED ORDER — SCOPOLAMINE 1 MG/3DAYS TD PT72
1.0000 | MEDICATED_PATCH | TRANSDERMAL | Status: DC
  Administered 2014-08-19 – 2014-08-22 (×2): 1.5 mg via TRANSDERMAL
  Filled 2014-08-19 (×3): qty 1

## 2014-08-19 MED ORDER — VANCOMYCIN HCL 1000 MG IV SOLR: 15.0000 mg/kg | Freq: Once | INTRAVENOUS | Status: DC

## 2014-08-19 MED ORDER — GLYCOPYRROLATE 0.2 MG/ML IJ SOLN
0.4000 mg | INTRAMUSCULAR | Status: DC | PRN
  Filled 2014-08-19: qty 2

## 2014-08-19 NOTE — H&P (Signed)
[facility] History and Physical                                                                                    Kurt [name], is a 81 y.o. male  MRN: [id number]   DOB - [birth year]  Admit Date - 08/19/2014  Outpatient Primary MD for the patient is Crawford Givens, MD  Referring Physician:  Dr. Jodi Mourning  Chief Complaint:   Chief Complaint  Patient presents with  . Altered Mental Status  . Pneumonia     HPI  Kurt [name]  is a 81 y.o. male, with a past medical history of dementia, stroke, coronary artery disease, sinus node dysfunction status post pacemaker placement. He presents to the emergency department with sepsis and lethargy.   [name] lives in the memory care unit [facility]. He is lethargic in the emergency department unable to speak, the history is gathered from his family. They report that for the last month he has been less and less communicative.  Today he was completely unresponsive.  He was recently diagnosed with right lower lobe pneumonia. His wife tells me that he has been pocketing food for the last week and a half. She believes he likely aspirated.  In the ER he flinches when touched but is otherwise unresponsive. Pulse rates 41 temperature 101.9 respirations of 33 blood pressure was 55/32 on admission. He is on a nonrebreather mask at 15 L. His white count is elevated at 21. Sodium 155 creatinine elevated at 1.54. He appears to have overwhelming sepsis. He has multiple nonhealing pressure ulcerations. His wife states that he has not recognized them for the past 2 years and that he would not want to live this way. After further conversation with his wife son and daughter, the joint decision was made for comfort care.  Review of Systems   The patient is unable to give a review of systems due to severe lethargy.   Past Medical History  Past Medical History  Diagnosis Date  . Hypertension   . Hyperlipidemia   . Renal  stones     stones  . Stroke   . CAD (coronary artery disease)   . Dementia     after CVA  . Foot drop, left   . Dementia   . Complication of anesthesia     trouble waking up in childhood  . Dyslipidemia   . Sinus node dysfunction 2004  . Systemic hypertension   . Hx of orthostatic hypotension     Past Surgical History  Procedure Laterality Date  . Appendectomy    . Tonsillectomy and adenoidectomy  1960's  . 3 back surgeries  1980's  . Pacemaker insertion  2000  . 5 bypass  04/29/2005  . Stroke during bypass surgery 04/29/2005 and another 05/17/05  2007  . Total knee replacement, left  10/07  . Total knee replacement, right  2/09  . 2nd pacemaker, generator (battery) replaced  01/08/11  . Permanent pacemaker generator change N/A 01/08/2011    Procedure: PERMANENT PACEMAKER GENERATOR CHANGE;  Surgeon: Thurmon Fair, MD;  Location: [facility] CATH LAB;  Service: Cardiovascular;  Laterality: N/A;  Social History History  Substance Use Topics  . Smoking status: Never Smoker   . Smokeless tobacco: Never Used  . Alcohol Use: No    Family History Family History  Problem Relation Age of Onset  . Cancer Brother     stomach  . Heart disease Other   . Heart disease Mother   . Heart disease Father   . Colon cancer Neg Hx   . Prostate cancer Neg Hx     Prior to Admission medications   Medication Sig Start Date End Date Taking? Authorizing Provider  acetaminophen (TYLENOL) 325 MG tablet Take 650 mg by mouth 2 (two) times daily as needed for mild pain.   Yes Historical Provider, MD  aspirin EC 81 MG tablet Take 1 tablet (81 mg total) by mouth daily. 05/29/13  Yes Mihai Croitoru, MD  benzonatate (TESSALON) 100 MG capsule Take 200 mg by mouth 2 (two) times daily as needed for cough.   Yes Historical Provider, MD  divalproex (DEPAKOTE SPRINKLE) 125 MG capsule Take 250 mg by mouth 2 (two) times daily. 07/27/14  Yes Historical Provider, MD  guaiFENesin (MUCINEX) 600 MG 12 hr tablet Take  600 mg by mouth 2 (two) times daily as needed for cough or to loosen phlegm.   Yes Historical Provider, MD  liver oil-zinc oxide (DESITIN) 40 % ointment Apply 1 application topically as needed for irritation.   Yes Historical Provider, MD  Melatonin 3 MG CAPS Take by mouth.   Yes Historical Provider, MD  nystatin cream (MYCOSTATIN) Apply 1 application topically 2 (two) times daily. Apply to groin 08/13/14  Yes Historical Provider, MD  QUEtiapine (SEROQUEL) 25 MG tablet Take 12.5 mg by mouth 2 (two) times daily. 07/23/14  Yes Historical Provider, MD  senna (SENOKOT) 8.6 MG TABS tablet Take 2 tablets by mouth at bedtime as needed for mild constipation.   Yes Historical Provider, MD  Skin Protectants, Misc. (ENDIT EX) Apply 1 application topically as needed.   Yes Historical Provider, MD    No Known Allergies  Physical Exam  Vitals  Blood pressure 78/50, pulse 82, temperature 101.9 F (38.8 C), temperature source Rectal, resp. rate 33, SpO2 92 %.   General:  elderly, chronically ill-appearing male lying in bed, shaking with rigors on a nonrebreather mask. Family at bedside  Psych: Responsive only to painful stimuli.  ENT:  eyes are closed. He does not open them. Mucous membranes appear dry.   Neck:  Supple, No lymphadenopathy appreciated  Respiratory:  chest sounds are wet with tachypnic breathing   Cardiac:   sounds irregular, difficult to hear breath sounds.  Abdomen:  Tender to palpation, soft, nondistended   Skin:  gray complexion, wounds not examined.    Data Review  CBC  Recent Labs Lab 08/19/14 1226  WBC 21.0*  HGB 15.2  HCT 46.1  PLT 329  MCV 97.5  MCH 32.1  MCHC 33.0  RDW 14.3  LYMPHSABS 0.6*  MONOABS 1.0  EOSABS 0.0  BASOSABS 0.0    Chemistries   Recent Labs Lab 08/19/14 1226  NA 155*  K 4.4  CL 125*  CO2 22  GLUCOSE 122*  BUN 49*  CREATININE 1.54*  CALCIUM 8.8*  AST 49*  ALT 32  ALKPHOS 42  BILITOT 2.0*     Imaging results:   Dg  Chest Portable 1 View  08/19/2014   CLINICAL DATA:  Altered consciousness. Possible right lower lobe pneumonia.  EXAM: PORTABLE CHEST - 1 VIEW  COMPARISON:  07/03/2013  FINDINGS: Prior median sternotomy. Numerous leads and wires project over the chest. Pacer with leads at right atrium and right ventricle. No lead discontinuity. Midline trachea. Normal heart size. No pleural effusion or pneumothorax. Increased right infrahilar density is favored to be due to volume loss and atelectasis. No lobar consolidation.  IMPRESSION: No definite acute disease. Probable right infrahilar volume loss. Consider PA and lateral radiographs.   Electronically Signed   By: Jeronimo Greaves M.D.   On: 08/19/2014 13:04    My personal review of EKG: Sinus rhythm, Bigeminy, Paced.    Assessment & Plan  Principal Problem:   Sepsis Active Problems:   HYPERLIPIDEMIA   HYPERTENSION, BENIGN ESSENTIAL   Coronary atherosclerosis   C V A/STROKE   Vascular dementia   CHB (complete heart block)   Pacemaker - Medtronic dual chamber, 2004, new generator 2012  Overwhelming sepsis from uncertain etiology likely aspiration pneumonia. Patient initially received IV fluids, and anti-biotics in the ER.   had discussion with wife and son and daughter, who all agreed on comfort care. DO NOT RESUSCITATE DO NOT INTUBATE   family understands that the patient likely has only days left to live We will make him total comfort. No further antibiotics or fluids. Morphine for dyspnea. Ativan for agitation. Palliative Medicine has been consulted. Will likely not survive this hospitalization. If he stabilizes we would recommend residential hospice.  Complete heart block Pacemaker dependent. Will leave pacemaker as is  HTN / HLD / Dementia / CVA / CAD Comfort measures only.   Consultants Called:  Palliative Medicine  Family Communication:   Wife, DTR, SON at bedside  Code Status:  DNR / DNI  Condition:  Not expected to survive this  hospitalization.  Potential Disposition:  Perhaps residential hospice if he stabilizes  Time spent in minutes : [address]  PA-C on 08/19/2014 at 5:09 PM Between 7am to 7pm - Pager - [phone] After 7pm go to [email or web address] - password [email or web address] And look for the night coverage person covering me after hours  [facility]

## 2014-08-19 NOTE — Consult Note (Signed)
Consultation Note Date: 08/19/2014   Patient Name: Kurt [name]  DOB: January 27, 1932  MRN: [id number]  Age / Sex: 81 y.o., male   PCP: Joaquim Nam, MD Referring Physician: Zannie Cove, MD  Reason for Consultation: Comfort care, symptom management  Palliative Care Assessment and Plan Summary of Established Goals of Care and Medical Treatment Preferences    Palliative Care Discussion Held Today:   Mr. Days is is minimally responsive and family at bedside (wife, son, daughter-in-law). They confirm the goal for comfort care and we discussed natural progression at end of life and symptom control. They all agree that "he has suffered enough." Explained that he has likely aspirated given his history of dementia and recent pocketing of food. We discussed options such as hospice facility and at home vs dying in hospital. At this time I believe he likely has very little time - likely hours and will pass in the hospital. Support provided to family and their pastor is there with them. They are very tearful.    Contacts/Participants in Discussion: Primary Decision Maker: Wife  Goals of Care/Code Status/Advance Care Planning:   Code Status: DNR  IVF: no  Antibiotics: no  Labs: no   Symptom Management:   Pain/Dyspnea: Morphine 1-2 mg every 15 min prn. If needing frequent doses - may consider utilizing morphine infusion 2 mg/hr with 1-2 mg bolus every 15 min prn.   Secretions: Robinul 0.4 mg every 4 hours prn.   Anxiety: Lorazepam 1 mg every 2 hours prn.   Psycho-social/Spiritual:   Support System: Good support - family at bedside. Personal pastor in waiting room with them.   Desire for further Chaplaincy support: not needed.   Prognosis: Likely hours  Discharge Planning: Likely anticipate hospital death       Chief Complaint: Lethargy  History of Present Illness: 81 yo male with dementia, stroke, CAD, HTN. Admitted with pneumonia with high suspicion of  aspiration. Now comfort care.   Primary Diagnoses  Present on Admission:  . Sepsis  Palliative Review of Systems:   Unable to assess - unresponsive.    I have reviewed the medical record, interviewed the patient and family, and examined the patient. The following aspects are pertinent.  Past Medical History  Diagnosis Date  . Hypertension   . Hyperlipidemia   . Renal stones     stones  . Stroke   . CAD (coronary artery disease)   . Dementia     after CVA  . Foot drop, left   . Dementia   . Complication of anesthesia     trouble waking up in childhood  . Dyslipidemia   . Sinus node dysfunction 2004  . Systemic hypertension   . Hx of orthostatic hypotension    History   Social History  . Marital Status: Married    Spouse Name: N/A  . Number of Children: N/A  . Years of Education: N/A   Occupational History  . Retired Radio broadcast assistant    Social History Main Topics  . Smoking status: Never Smoker   . Smokeless tobacco: Never Used  . Alcohol Use: No  . Drug Use: No  . Sexual Activity: Not on file   Other Topics Concern  . None   Social History Narrative   Education:  12th grade   Married 1957   Retired Surveyor, minerals   From [place]   Family History  Problem Relation Age of Onset  . Cancer Brother     stomach  . Heart  disease Other   . Heart disease Mother   . Heart disease Father   . Colon cancer Neg Hx   . Prostate cancer Neg Hx    Scheduled Meds: . scopolamine  1 patch Transdermal Q72H   Continuous Infusions:  PRN Meds:.glycopyrrolate, LORazepam, morphine injection Medications Prior to Admission:  Prior to Admission medications   Medication Sig Start Date End Date Taking? Authorizing Provider  acetaminophen (TYLENOL) 325 MG tablet Take 650 mg by mouth 2 (two) times daily as needed for mild pain.   Yes Historical Provider, MD  aspirin EC 81 MG tablet Take 1 tablet (81 mg total) by mouth daily. 05/29/13  Yes Mihai Croitoru, MD  benzonatate  (TESSALON) 100 MG capsule Take 200 mg by mouth 2 (two) times daily as needed for cough.   Yes Historical Provider, MD  divalproex (DEPAKOTE SPRINKLE) 125 MG capsule Take 250 mg by mouth 2 (two) times daily. 07/27/14  Yes Historical Provider, MD  guaiFENesin (MUCINEX) 600 MG 12 hr tablet Take 600 mg by mouth 2 (two) times daily as needed for cough or to loosen phlegm.   Yes Historical Provider, MD  liver oil-zinc oxide (DESITIN) 40 % ointment Apply 1 application topically as needed for irritation.   Yes Historical Provider, MD  Melatonin 3 MG CAPS Take by mouth.   Yes Historical Provider, MD  nystatin cream (MYCOSTATIN) Apply 1 application topically 2 (two) times daily. Apply to groin 08/13/14  Yes Historical Provider, MD  QUEtiapine (SEROQUEL) 25 MG tablet Take 12.5 mg by mouth 2 (two) times daily. 07/23/14  Yes Historical Provider, MD  senna (SENOKOT) 8.6 MG TABS tablet Take 2 tablets by mouth at bedtime as needed for mild constipation.   Yes Historical Provider, MD  Skin Protectants, Misc. (ENDIT EX) Apply 1 application topically as needed.   Yes Historical Provider, MD   No Known Allergies CBC:    Component Value Date/Time   WBC 21.0* 08/19/2014 1226   HGB 15.2 08/19/2014 1226   HCT 46.1 08/19/2014 1226   PLT 329 08/19/2014 1226   MCV 97.5 08/19/2014 1226   NEUTROABS 19.4* 08/19/2014 1226   LYMPHSABS 0.6* 08/19/2014 1226   MONOABS 1.0 08/19/2014 1226   EOSABS 0.0 08/19/2014 1226   BASOSABS 0.0 08/19/2014 1226   Comprehensive Metabolic Panel:    Component Value Date/Time   NA 155* 08/19/2014 1226   K 4.4 08/19/2014 1226   CL 125* 08/19/2014 1226   CO2 22 08/19/2014 1226   BUN 49* 08/19/2014 1226   CREATININE 1.54* 08/19/2014 1226   CREATININE 1.03 07/10/2013 1334   GLUCOSE 122* 08/19/2014 1226   CALCIUM 8.8* 08/19/2014 1226   AST 49* 08/19/2014 1226   ALT 32 08/19/2014 1226   ALKPHOS 42 08/19/2014 1226   BILITOT 2.0* 08/19/2014 1226   PROT 6.2* 08/19/2014 1226   ALBUMIN 2.3*  08/19/2014 1226    Physical Exam:  Vital Signs: BP 78/50 mmHg  Pulse 82  Temp(Src) 101.9 F (38.8 C) (Rectal)  Resp 33  SpO2 92% SpO2: SpO2: 92 % O2 Device: O2 Device: NRB O2 Flow Rate: O2 Flow Rate (L/min): 15 L/min Intake/output summary: No intake or output data in the 24 hours ending 08/19/14 1602  Exam Findings:  General: NAD, lying in bed HEENT: [place]/AT CVS: Paced, RRR Resp: Breathing does not appear labored, nonrebreather Abd: Soft, NT, ND Extrem: Bilat feet wrapped, toes cool to touch, withdraws to pain when lightly touch feet Neuro: Unresponsive  Palliative Performance Scale: 10%                Additional Data Reviewed: Recent Labs     08/19/14  1226  WBC  21.0*  HGB  15.2  PLT  329  NA  155*  BUN  49*  CREATININE  1.54*     Time In: 1500 Time Out: 1600 Time Total:  Greater than 50%  of this time was spent counseling and coordinating care related to the above assessment and plan.  Signed by:  Yong Channel, NP Palliative Medicine Team Pager # [phone] (M-F 8a-5p) Team Phone # [phone] (Nights/Weekends)

## 2014-08-19 NOTE — Progress Notes (Signed)
Chaplain responded to page for family support. Pt has large and supportive family. Pt pastor present and providing spiritual support. PT pastor states that he will be present as long as wife feels it is helpful. Chaplain offered to keep pt family in prayers; gesture appreciated. Chaplain offered to walk pt family up to unit when pt is moved. Page chaplain as needed, [phone].   08/19/14 1600  Clinical Encounter Type  Visited With Family  Visit Type Initial;Spiritual support  Referral From Nurse  Consult/Referral To Faith community  Spiritual Encounters  Spiritual Needs Emotional;Grief support  Stress Factors  Family Stress Factors Loss  Merlean Pizzini, Mayer Masker, Chaplain 08/19/2014 4:06 PM

## 2014-08-19 NOTE — ED Notes (Signed)
Pt reports to the ED for eval of altered LOC. He is from the memory care unit at [facility] and recntly had a chest x-ray done and was dx with RLL pneumonia. Today staff noticed he was increasingly lethargic. He only responds to physical stimuli. He is normally alert but disoriented. Rhonchi noted throughout. He has a pacemaker present to the right chest. HR in the 80s. He is tachypnic in the 30s. He feels warm to touch. O2 93% on NBR at 15 L. Fingernails appear cyanotic and with cap refill >3 seconds. DNR at bedside.

## 2014-08-19 NOTE — ED Notes (Signed)
Port X-ray at bedside.  

## 2014-08-19 NOTE — ED Notes (Signed)
EDP made aware of hypotension.

## 2014-08-20 DIAGNOSIS — J690 Pneumonitis due to inhalation of food and vomit: Secondary | ICD-10-CM

## 2014-08-20 DIAGNOSIS — L8990 Pressure ulcer of unspecified site, unspecified stage: Secondary | ICD-10-CM | POA: Insufficient documentation

## 2014-08-20 NOTE — ED Provider Notes (Signed)
CSN: [id number]     Arrival date & time 08/19/14  1159 History   First MD Initiated Contact with Patient 08/19/14 1203     Chief Complaint  Patient presents with  . Altered Mental Status  . Pneumonia     (Consider location/radiation/quality/duration/timing/severity/associated sxs/prior Treatment) HPI Comments: 81 yo with complex medical hx presents with worsening respiratory sxs the past few days, worsening lethargy.  Pt DNR.  Hx of stroke and dementia, pneumonia.  Pt has pacemaker in place.  Pt lives [facility].  Pt on NRB on arrival, low saturations prior.  Unable to obtain details from pt.  Family assists.  Wife does not want aggressive care more than fluids/ abx.  No smoking hx.  Patient is a 81 y.o. male presenting with altered mental status and pneumonia. The history is provided by medical records and a relative.  Altered Mental Status Pneumonia    Past Medical History  Diagnosis Date  . Hypertension   . Hyperlipidemia   . Renal stones     stones  . Stroke   . CAD (coronary artery disease)   . Dementia     after CVA  . Foot drop, left   . Dementia   . Complication of anesthesia     trouble waking up in childhood  . Dyslipidemia   . Sinus node dysfunction 2004  . Systemic hypertension   . Hx of orthostatic hypotension    Past Surgical History  Procedure Laterality Date  . Appendectomy    . Tonsillectomy and adenoidectomy  1960's  . 3 back surgeries  1980's  . Pacemaker insertion  2000  . 5 bypass  04/29/2005  . Stroke during bypass surgery 04/29/2005 and another 05/17/05  2007  . Total knee replacement, left  10/07  . Total knee replacement, right  2/09  . 2nd pacemaker, generator (battery) replaced  01/08/11  . Permanent pacemaker generator change N/A 01/08/2011    Procedure: PERMANENT PACEMAKER GENERATOR CHANGE;  Surgeon: Thurmon Fair, MD;  Location: [facility] CATH [facility];  Service: Cardiovascular;  Laterality: N/A;   Family History  Problem Relation Age of Onset  .  Cancer Brother     stomach  . Heart disease Other   . Heart disease Mother   . Heart disease Father   . Colon cancer Neg Hx   . Prostate cancer Neg Hx    History  Substance Use Topics  . Smoking status: Never Smoker   . Smokeless tobacco: Never Used  . Alcohol Use: No    Review of Systems  Unable to perform ROS: Mental status change      Allergies  Review of patient's allergies indicates no known allergies.  Home Medications   Prior to Admission medications   Medication Sig Start Date End Date Taking? Authorizing Provider  acetaminophen (TYLENOL) 325 MG tablet Take 650 mg by mouth 2 (two) times daily as needed for mild pain.   Yes Historical Provider, MD  aspirin EC 81 MG tablet Take 1 tablet (81 mg total) by mouth daily. 05/29/13  Yes Mihai Croitoru, MD  benzonatate (TESSALON) 100 MG capsule Take 200 mg by mouth 2 (two) times daily as needed for cough.   Yes Historical Provider, MD  divalproex (DEPAKOTE SPRINKLE) 125 MG capsule Take 250 mg by mouth 2 (two) times daily. 07/27/14  Yes Historical Provider, MD  guaiFENesin (MUCINEX) 600 MG 12 hr tablet Take 600 mg by mouth 2 (two) times daily as needed for cough or to loosen phlegm.  Yes Historical Provider, MD  liver oil-zinc oxide (DESITIN) 40 % ointment Apply 1 application topically as needed for irritation.   Yes Historical Provider, MD  Melatonin 3 MG CAPS Take by mouth.   Yes Historical Provider, MD  nystatin cream (MYCOSTATIN) Apply 1 application topically 2 (two) times daily. Apply to groin 08/13/14  Yes Historical Provider, MD  QUEtiapine (SEROQUEL) 25 MG tablet Take 12.5 mg by mouth 2 (two) times daily. 07/23/14  Yes Historical Provider, MD  senna (SENOKOT) 8.6 MG TABS tablet Take 2 tablets by mouth at bedtime as needed for mild constipation.   Yes Historical Provider, MD  Skin Protectants, Misc. (ENDIT EX) Apply 1 application topically as needed.   Yes Historical Provider, MD   BP 87/73 mmHg  Pulse 137  Temp(Src) 98 F  (36.7 C) (Axillary)  Resp 18  SpO2 87% Physical Exam  Constitutional: He appears distressed.  HENT:  Head: Normocephalic.  Dry mm  Eyes: Right eye exhibits no discharge. Left eye exhibits no discharge.  Neck: Neck supple.  Cardiovascular: Tachycardia present.   Pulmonary/Chest: He has rales (tachypnea, mild retractions intercostal).  Abdominal: Soft. He exhibits no distension. There is no tenderness.  Musculoskeletal: He exhibits edema (minimal LE bilateral).  Neurological: He is disoriented. A cranial nerve deficit is present.  Lethargic, no verbal response, responds to pain. Difficult exam due to ams  Skin: Rash (multiple ulcers, heals bilateral) noted.  Psychiatric:  lethargic  Nursing note and vitals reviewed.   ED Course  Procedures (including critical care time) CRITICAL CARE Performed by: Enid Skeens   Total critical care time: 40 min  Critical care time was exclusive of separately billable procedures and treating other patients.  Critical care was necessary to treat or prevent imminent or life-threatening deterioration.  Critical care was time spent personally by me on the following activities: development of treatment plan with patient and/or surrogate as well as nursing, discussions with consultants, evaluation of patient's response to treatment, examination of patient, obtaining history from patient or surrogate, ordering and performing treatments and interventions, ordering and review of laboratory studies, ordering and review of radiographic studies, pulse oximetry and re-evaluation of patient's condition.  Labs Review Labs Reviewed  COMPREHENSIVE METABOLIC PANEL - Abnormal; Notable for the following:    Sodium 155 (*)    Chloride 125 (*)    Glucose, Bld 122 (*)    BUN 49 (*)    Creatinine, Ser 1.54 (*)    Calcium 8.8 (*)    Total Protein 6.2 (*)    Albumin 2.3 (*)    AST 49 (*)    Total Bilirubin 2.0 (*)    GFR calc non Af Amer 41 (*)    GFR calc  Af Amer 47 (*)    All other components within normal limits  CBC WITH DIFFERENTIAL/PLATELET - Abnormal; Notable for the following:    WBC 21.0 (*)    Neutrophils Relative % 92 (*)    Neutro Abs 19.4 (*)    Lymphocytes Relative 3 (*)    Lymphs Abs 0.6 (*)    All other components within normal limits  I-STAT CG4 LACTIC ACID, ED - Abnormal; Notable for the following:    Lactic Acid, Venous 2.83 (*)    All other components within normal limits  CULTURE, BLOOD (ROUTINE X 2)  CULTURE, BLOOD (ROUTINE X 2)    Imaging Review Dg Chest Portable 1 View  08/19/2014   CLINICAL DATA:  Altered consciousness. Possible right lower lobe pneumonia.  EXAM: PORTABLE CHEST - 1 VIEW  COMPARISON:  07/03/2013  FINDINGS: Prior median sternotomy. Numerous leads and wires project over the chest. Pacer with leads at right atrium and right ventricle. No lead discontinuity. Midline trachea. Normal heart size. No pleural effusion or pneumothorax. Increased right infrahilar density is favored to be due to volume loss and atelectasis. No lobar consolidation.  IMPRESSION: No definite acute disease. Probable right infrahilar volume loss. Consider PA and lateral radiographs.   Electronically Signed   By: Jeronimo Greaves M.D.   On: 08/19/2014 13:04     EKG Interpretation   Date/Time:  Monday August 19 2014 12:04:23 EDT Ventricular Rate:  92 PR Interval:  225 QRS Duration: 155 QT Interval:  408 QTC Calculation: 505 R Axis:   -82 Text Interpretation:  Sinus rhythm Supraventricular bigeminy Prolonged PR  interval Nonspecific IVCD with LAD Left ventricular hypertrophy paced  Confirmed by Cherese Lozano  MD, Shabazz Mckey ([id number]) on 08/19/2014 2:04:22 PM      MDM   Final diagnoses:  Sepsis due to undetermined organism  HCAP (healthcare-associated pneumonia)  Skin ulcers of both feet   Concern for sepsis from pneumonia, other possible sources.   Pt bp worsened in ED, 4 L fluids given. Broad abx and cxs initially.  Mulitiple discussions  with wife, discussed with palliative care and hospitalist.  The patients results and plan were reviewed and discussed.   Any x-rays performed were personally reviewed by myself.   Differential diagnosis were considered with the presenting HPI.  Medications  scopolamine (TRANSDERM-SCOP) 1 MG/3DAYS 1.5 mg (1.5 mg Transdermal Patch Applied 08/19/14 1528)  ondansetron (ZOFRAN) tablet 4 mg (not administered)    Or  ondansetron (ZOFRAN) injection 4 mg (not administered)  morphine 2 MG/ML injection 1-2 mg (2 mg Intravenous Given 08/20/14 2223)  LORazepam (ATIVAN) injection 1 mg (not administered)  glycopyrrolate (ROBINUL) injection 0.4 mg (not administered)  chlorhexidine (PERIDEX) 0.12 % solution 15 mL (15 mLs Mouth Rinse Given 08/20/14 2012)  antiseptic oral rinse (CPC / CETYLPYRIDINIUM CHLORIDE 0.05%) solution 7 mL (7 mLs Mouth Rinse Given 08/20/14 1624)  piperacillin-tazobactam (ZOSYN) IVPB 3.375 g (0 g Intravenous Stopped 08/19/14 1344)  sodium chloride 0.9 % bolus 1,000 mL (0 mLs Intravenous Stopped 08/19/14 1528)  sodium chloride 0.9 % bolus 1,000 mL (0 mLs Intravenous Stopped 08/19/14 1528)  sodium chloride 0.9 % bolus 1,000 mL (0 mLs Intravenous Stopped 08/19/14 1528)    Filed Vitals:   08/19/14 1715 08/19/14 2223 08/20/14 0654 08/20/14 2208  BP:   Pulse: 86 114 109 137  Temp:  99.9 F (37.7 C) 98.9 F (37.2 C) 98 F (36.7 C)  TempSrc:  Axillary Oral Axillary  Resp: SpO2: 93% 91% 90% 87%    Final diagnoses:  Sepsis due to undetermined organism  HCAP (healthcare-associated pneumonia)  Skin ulcers of both feet  Septic shock Hypoxia  Admission/ observation were discussed with the admitting physician, patient and/or family and they are comfortable with the plan.    Blane Ohara, MD 08/20/14 2236

## 2014-08-20 NOTE — Progress Notes (Signed)
PROGRESS NOTE  KEONTE DAUBENSPECK ZOX:[id number] DOB: December 13, 1931 DOA: 08/19/2014 PCP: Crawford Givens, MD  HPI/Recap of past 47 hours: 81 year old male with past history of dementia and CVA brought in on 6/19 from skilled nursing facility after he was found to be unresponsive, slowly worsening over the last month.  Found tobe in septic shock from a suspected right lower lobe aspiration pneumonia.  Given overall decline, poor quality of life even prior to this event, patient's family decided to make him comfort care.  Patient seen in room. Family present. Comfortable on supplemental oxygen mask.  Assessment/Plan: Principal Problem:   Sepsis secondary to aspiration pneumonia: Plan for comfort care only. Medication for pain, agitation. Appreciate palliative care support. Holding home medications. Patient meets criteria for sepsis given lactic acid level, hypotension, tachycardia and pulmonary source    Vascular dementia   CHB (complete heart block)status post pacemaker. We will need to discontinue pacemaker once patient expires    Pressure ulcer: Present on admission   Code Status: comfort care  Family Communication: wife and daughters at the bedside  Disposition Plan: patient will likely pass in the next 1-2 days   Consultants:  Palliative care  Procedures:  none  Antibiotics:  none   Objective: BP 85/54 mmHg  Pulse 109  Temp(Src) 98.9 F (37.2 C) (Oral)  Resp 17  SpO2 90%  Intake/Output Summary (Last 24 hours) at 08/20/14 1848 Last data filed at 08/20/14 1400  Gross per 24 hour  Intake      0 ml  Output      0 ml  Net      0 ml   There were no vitals filed for this visit.  Exam:   General:  Nonresponsive, occasionally will spontaneously open eyes and then close them again  Cardiovascular: regular rhythm, borderline tachycardia  Respiratory: decreased breath sounds throughout  Abdomen: soft, nondistended, hypoactive bowel sounds  Musculoskeletal: no  clubbing or cyanosis or edema   Data Reviewed: Basic Metabolic Panel:  Recent Labs Lab 08/19/14 1226  NA 155*  K 4.4  CL 125*  CO2 22  GLUCOSE 122*  BUN 49*  CREATININE 1.54*  CALCIUM 8.8*   Liver Function Tests:  Recent Labs Lab 08/19/14 1226  AST 49*  ALT 32  ALKPHOS 42  BILITOT 2.0*  PROT 6.2*  ALBUMIN 2.3*   No results for input(s): LIPASE, AMYLASE in the last 168 hours. No results for input(s): AMMONIA in the last 168 hours. CBC:  Recent Labs Lab 08/19/14 1226  WBC 21.0*  NEUTROABS 19.4*  HGB 15.2  HCT 46.1  MCV 97.5  PLT 329   Cardiac Enzymes:   No results for input(s): CKTOTAL, CKMB, CKMBINDEX, TROPONINI in the last 168 hours. BNP (last 3 results) No results for input(s): BNP in the last 8760 hours.  ProBNP (last 3 results) No results for input(s): PROBNP in the last 8760 hours.  CBG: No results for input(s): GLUCAP in the last 168 hours.  Recent Results (from the past 240 hour(s))  Culture, blood (routine x 2)     Status: None (Preliminary result)   Collection Time: 08/19/14 12:20 PM  Result Value Ref Range Status   Specimen Description BLOOD LEFT FOREARM  Final   Special Requests BOTTLES DRAWN AEROBIC ONLY 2CC  Final   Culture NO GROWTH < 24 HOURS  Final   Report Status PENDING  Incomplete  Culture, blood (routine x 2)     Status: None (Preliminary result)   Collection Time: 08/19/14  12:26 PM  Result Value Ref Range Status   Specimen Description BLOOD ARM RIGHT  Final   Special Requests BOTTLES DRAWN AEROBIC AND ANAEROBIC 10CC  Final   Culture  Setup Time   Final    GRAM POSITIVE COCCI IN CLUSTERS IN BOTH AEROBIC AND ANAEROBIC BOTTLES CRITICAL RESULT CALLED TO, READ BACK BY AND VERIFIED WITH: K.JOSEPHINE RN 0600 08/20/14 E.GADDY    Culture NO GROWTH < 24 HOURS  Final   Report Status PENDING  Incomplete     Studies: No results found.  Scheduled Meds: . antiseptic oral rinse  7 mL Mouth Rinse q12n4p  . chlorhexidine  15 mL Mouth  Rinse BID  . scopolamine  1 patch Transdermal Q72H    Continuous Infusions:    Time spent: 15 minutes  Hollice Espy  [facility] Pager [phone]. If 7PM-7AM, please contact night-coverage at [email or web address], password [facility] 08/20/2014, 6:48 PM  LOS: 1 day

## 2014-08-20 NOTE — Progress Notes (Signed)
Daily Progress Note   Patient Name: Kurt [name]       Date: 08/20/2014 DOB: 08-14-1931  Age: 81 y.o. MRN#: [id number] Attending Physician: Hollice Espy, MD Primary Care Physician: Crawford Givens, MD Admit Date: 08/19/2014  Subjective:     Mr. Reister is lying in bed. Long discussion with family about nonrebreather and pacemaker and prolonging. Explained how pacemaker does not cause him any pain as it has not already and that as he progresses the pacemaker will be ineffective and will not prolong death as his heart muscle will not respond. They agree to keep on pacemaker. Wife wants to wait until all her children are here before transitioning of nonrebreather. They say they do not want to prolong as he has been suffering for years. Support provided.    Length of Stay: 1 day  Current Medications: Scheduled Meds:  . antiseptic oral rinse  7 mL Mouth Rinse q12n4p  . chlorhexidine  15 mL Mouth Rinse BID  . scopolamine  1 patch Transdermal Q72H    Continuous Infusions:    PRN Meds: glycopyrrolate, LORazepam, morphine injection, ondansetron **OR** ondansetron (ZOFRAN) IV  Palliative Performance Scale: 10%     Vital Signs: BP 85/54 mmHg  Pulse 109  Temp(Src) 98.9 F (37.2 C) (Oral)  Resp 17  SpO2 90% SpO2: SpO2: 90 % O2 Device: O2 Device: NRB O2 Flow Rate: O2 Flow Rate (L/min): 15 L/min  Intake/output summary:  Intake/Output Summary (Last 24 hours) at 08/20/14 1249 Last data filed at 08/20/14 1100  Gross per 24 hour  Intake      0 ml  Output      0 ml  Net      0 ml   LBM: Last BM Date: 08/19/14  Physical Exam: General: NAD, lying in bed HEENT: [place]/AT CVS: Paced, RRR Resp: Breathing does not appear labored, nonrebreather Abd: Soft, NT, ND Extrem: Bilat feet wrapped, toes cool to touch, withdraws to pain when lightly touch feet Neuro: Unresponsive   Additional Data Reviewed: Recent Labs     08/19/14  1226  WBC  21.0*  HGB  15.2  PLT  329  NA   155*  BUN  49*  CREATININE  1.54*     Problem List:  Patient Active Problem List   Diagnosis Date Noted  . Pressure ulcer 08/20/2014  . Sepsis 08/19/2014  . Palliative care encounter 08/19/2014  . HCAP (healthcare-associated pneumonia)   . Sepsis due to undetermined organism   . Edema 10/07/2013  . Insomnia 10/07/2013  . CHB (complete heart block) 05/30/2013  . Pacemaker - Medtronic dual chamber, 2004, new generator 2012 05/30/2013  . Falls frequently 05/30/2013  . Vascular dementia 11/14/2012  . GERD (gastroesophageal reflux disease) 08/27/2012  . Vomiting 07/26/2012  . Inguinal hernia 05/11/2012  . Phimosis 01/26/2012  . Cough 10/12/2011  . Dementia 07/01/2011  . CHRONIC RHINITIS 11/19/2008  . HEMOPTYSIS 11/19/2008  . WRIST PAIN, RIGHT 12/13/2007  . SLEEP APNEA 12/13/2007  . ORTHOSTATIC DIZZINESS 04/25/2007  . HYPERLIPIDEMIA 03/23/2007  . Depressive disorder, not elsewhere classified 03/23/2007  . HYPERTENSION, BENIGN ESSENTIAL 03/23/2007  . Coronary atherosclerosis 03/23/2007  . Esophagitis 03/23/2007  . KIDNEY STONE 03/23/2007  . DEGENERATIVE JOINT DISEASE, KNEES, BILATERAL 03/23/2007  . C V A/STROKE 04/29/2005     Palliative Care Assessment & Plan    Code Status:  DNR  Goals of Care:  Comfort care  Desire for further Chaplaincy support: personal pastor supporting  3. Symptom Management:  Pain/Dyspnea: Morphine 1-2 mg every 15 min prn. If needing frequent doses - may consider utilizing morphine infusion 2 mg/hr with 1-2 mg bolus every 15 min prn.   Secretions: Robinul 0.4 mg every 4 hours prn.   Anxiety: Lorazepam 1 mg every 2 hours prn.     5. Prognosis: Hours - Days  5. Discharge Planning: Anticipate hospital death    Thank you for allowing the Palliative Medicine Team to assist in the care of this patient.   Time In: 0930 Time Out: 1015 Total Time Prolonged Time Billed  no     Greater than 50%  of this time was spent  counseling and coordinating care related to the above assessment and plan.     Yong Channel, NP Palliative Medicine Team Pager # [phone] (M-F 8a-5p) Team Phone # [phone] (Nights/Weekends)  08/20/2014, 12:49 PM

## 2014-08-20 NOTE — Progress Notes (Signed)
Nutrition Brief Note  Chart reviewed. Pt now transitioning to comfort care.  No further nutrition interventions warranted at this time.  Please re-consult as needed.   Rolin Schult A. Shawne Bulow, RD, LDN, CDE Pager: [phone] After hours Pager: [phone]  

## 2014-08-20 NOTE — Care Management Note (Signed)
Case Management Note  Patient Details  Name: Kurt [name] MRN: [id number] Date of Birth: 1931/07/05  Subjective/Objective:                    Action/Plan: From [facility]   Palliative Care note :  Prognosis: Likely hours  Discharge Planning: Likely anticipate hospital death   Expected Discharge Date:       08-20-14           Expected Discharge Plan:     In-House Referral:     Discharge planning Services     Post Acute Care Choice:    Choice offered to:     DME Arranged:    DME Agency:     HH Arranged:    HH Agency:     Status of Service:  In process, will continue to follow  Medicare Important Message Given:    Date Medicare IM Given:    Medicare IM give by:    Date Additional Medicare IM Given:    Additional Medicare Important Message give by:     If discussed at Long Length of Stay Meetings, dates discussed:    Additional Comments:  Kingsley Plan, RN 08/20/2014, 10:02 AM

## 2014-08-21 ENCOUNTER — Encounter (HOSPITAL_COMMUNITY): Payer: Self-pay | Admitting: General Practice

## 2014-08-21 ENCOUNTER — Telehealth: Payer: Self-pay | Admitting: Cardiology

## 2014-08-21 ENCOUNTER — Encounter: Payer: Commercial Managed Care - HMO | Admitting: *Deleted

## 2014-08-21 DIAGNOSIS — Z950 Presence of cardiac pacemaker: Secondary | ICD-10-CM

## 2014-08-21 DIAGNOSIS — I6789 Other cerebrovascular disease: Secondary | ICD-10-CM

## 2014-08-21 MED ORDER — ACETAMINOPHEN 650 MG RE SUPP
650.0000 mg | Freq: Four times a day (QID) | RECTAL | Status: DC | PRN
  Administered 2014-08-23: 650 mg via RECTAL
  Filled 2014-08-21: qty 1

## 2014-08-21 NOTE — Progress Notes (Signed)
PROGRESS NOTE  Kurt [name] WVP:[id number] DOB: 1932/02/14 DOA: 08/19/2014 PCP: Crawford Givens, MD  HPI/Recap of past 28 hours: 81 year old male with past history of dementia and CVA brought in on 6/19 from skilled nursing facility after he was found to be unresponsive, slowly worsening over the last month.  Found tobe in septic shock from a suspected right lower lobe aspiration pneumonia.  Given overall decline, poor quality of life even prior to this event, patient's family decided to make him comfort care.  Patient seen in room. Family present. Comfortable on supplemental oxygen mask.  Subjective Patient appears to be comfortable, in no acute distress, wife is by the bedside, requesting that the patient not be moved at this time , patient has a fever 100.4  Assessment/Plan: Principal Problem:   Sepsis secondary to aspiration pneumonia: Plan for comfort care only. Medication for pain, agitation. Appreciate palliative care support. Holding home medications. Patient meets criteria for sepsis given lactic acid level, hypotension, tachycardia and pulmonary source,    Vascular dementia   CHB (complete heart block)status post pacemaker. We will need to discontinue pacemaker once patient expires    Pressure ulcer: Present on admission   Code Status: comfort care  Family Communication: wife and daughters at the bedside  Disposition Plan: Palliative care to reassess for placement to inpatient hospice   Consultants:  Palliative care  Procedures:  none  Antibiotics:  none   Objective: BP 96/65 mmHg  Pulse 117  Temp(Src) 100.4 F (38 C) (Axillary)  Resp 18  SpO2 90%  Intake/Output Summary (Last 24 hours) at 08/21/14 1102 Last data filed at 08/21/14 5462  Gross per 24 hour  Intake      0 ml  Output      2 ml  Net     -2 ml   There were no vitals filed for this visit.  Exam:   General:  Nonresponsive, occasionally will spontaneously open eyes and then close  them again  Cardiovascular: regular rhythm, borderline tachycardia  Respiratory: decreased breath sounds throughout  Abdomen: soft, nondistended, hypoactive bowel sounds  Musculoskeletal: no clubbing or cyanosis or edema   Data Reviewed: Basic Metabolic Panel:  Recent Labs Lab 08/19/14 1226  NA 155*  K 4.4  CL 125*  CO2 22  GLUCOSE 122*  BUN 49*  CREATININE 1.54*  CALCIUM 8.8*   Liver Function Tests:  Recent Labs Lab 08/19/14 1226  AST 49*  ALT 32  ALKPHOS 42  BILITOT 2.0*  PROT 6.2*  ALBUMIN 2.3*   No results for input(s): LIPASE, AMYLASE in the last 168 hours. No results for input(s): AMMONIA in the last 168 hours. CBC:  Recent Labs Lab 08/19/14 1226  WBC 21.0*  NEUTROABS 19.4*  HGB 15.2  HCT 46.1  MCV 97.5  PLT 329   Cardiac Enzymes:   No results for input(s): CKTOTAL, CKMB, CKMBINDEX, TROPONINI in the last 168 hours. BNP (last 3 results) No results for input(s): BNP in the last 8760 hours.  ProBNP (last 3 results) No results for input(s): PROBNP in the last 8760 hours.  CBG: No results for input(s): GLUCAP in the last 168 hours.  Recent Results (from the past 240 hour(s))  Culture, blood (routine x 2)     Status: None (Preliminary result)   Collection Time: 08/19/14 12:20 PM  Result Value Ref Range Status   Specimen Description BLOOD LEFT FOREARM  Final   Special Requests BOTTLES DRAWN AEROBIC ONLY 2CC  Final   Culture NO GROWTH <  24 HOURS  Final   Report Status PENDING  Incomplete  Culture, blood (routine x 2)     Status: None (Preliminary result)   Collection Time: 08/19/14 12:26 PM  Result Value Ref Range Status   Specimen Description BLOOD ARM RIGHT  Final   Special Requests BOTTLES DRAWN AEROBIC AND ANAEROBIC 10CC  Final   Culture  Setup Time   Final    GRAM POSITIVE COCCI IN CLUSTERS IN BOTH AEROBIC AND ANAEROBIC BOTTLES CRITICAL RESULT CALLED TO, READ BACK BY AND VERIFIED WITH: K.JOSEPHINE RN 0600 08/20/14 E.GADDY    Culture NO  GROWTH < 24 HOURS  Final   Report Status PENDING  Incomplete     Studies: No results found.  Scheduled Meds: . antiseptic oral rinse  7 mL Mouth Rinse q12n4p  . chlorhexidine  15 mL Mouth Rinse BID  . scopolamine  1 patch Transdermal Q72H    Continuous Infusions:    Time spent: 15 minutes  [facility]  [facility] Pager [phone]. If 7PM-7AM, please contact night-coverage at [email or web address], password [facility] 08/21/2014, 11:02 AM  LOS: 2 days

## 2014-08-21 NOTE — Progress Notes (Signed)
Daily Progress Note   Patient Name: Kurt [name]       Date: 08/21/2014 DOB: 09/01/1931  Age: 81 y.o. MRN#: [id number] Attending Physician: Richarda Overlie, MD Primary Care Physician: Crawford Givens, MD Admit Date: 08/19/2014  Subjective:     Family, including son Arlys John, at bedside. Many questions answered. Discussed symptom management and he is febrile and does appear more labored breathing today than yesterday and is now on room air (NRB has been d/c) - asked RN to give prn. Family at bedside does not want him to be moved but agree if this is necessary they would want hospice - [facility]. Wife not at bedside. Emotional support provided.   Clarified with family that pacemaker is not a problem and does not need to be worried about now or after he passes. Only defibrillator would cause him discomfort. Pacemaker will not prolong life or keep him from passing.    Length of Stay: 2 days  Current Medications: Scheduled Meds:  . antiseptic oral rinse  7 mL Mouth Rinse q12n4p  . chlorhexidine  15 mL Mouth Rinse BID  . scopolamine  1 patch Transdermal Q72H    Continuous Infusions:    PRN Meds: glycopyrrolate, LORazepam, morphine injection, ondansetron **OR** ondansetron (ZOFRAN) IV  Palliative Performance Scale: 10%     Vital Signs: BP 96/65 mmHg  Pulse 117  Temp(Src) 100.4 F (38 C) (Axillary)  Resp 18  SpO2 90% SpO2: SpO2: 90 % O2 Device: O2 Device: Not Delivered O2 Flow Rate: O2 Flow Rate (L/min): 15 L/min  Intake/output summary:  Intake/Output Summary (Last 24 hours) at 08/21/14 1309 Last data filed at 08/21/14 4098  Gross per 24 hour  Intake      0 ml  Output      2 ml  Net     -2 ml   LBM: Last BM Date: 08/19/14  Physical Exam: General: NAD, lying in bed HEENT: [place]/AT CVS: Paced, RRR Resp: Breathing more labored and irregular today Abd: Soft, NT, ND Extrem: Bilat feet wrapped, toes cool to touch, withdraws to pain when lightly touch feet Neuro:  Minimally responsive, he did attempt to open eyes to voice  Additional Data Reviewed: Recent Labs     08/19/14  1226  WBC  21.0*  HGB  15.2  PLT  329  NA  155*  BUN  49*  CREATININE  1.54*     Problem List:  Patient Active Problem List   Diagnosis Date Noted  . Pressure ulcer 08/20/2014  . Aspiration pneumonia   . Sepsis 08/19/2014  . Palliative care encounter 08/19/2014  . HCAP (healthcare-associated pneumonia)   . Sepsis due to undetermined organism   . Edema 10/07/2013  . Insomnia 10/07/2013  . CHB (complete heart block) 05/30/2013  . Pacemaker - Medtronic dual chamber, 2004, new generator 2012 05/30/2013  . Falls frequently 05/30/2013  . Vascular dementia 11/14/2012  . GERD (gastroesophageal reflux disease) 08/27/2012  . Vomiting 07/26/2012  . Inguinal hernia 05/11/2012  . Phimosis 01/26/2012  . Cough 10/12/2011  . Dementia 07/01/2011  . CHRONIC RHINITIS 11/19/2008  . HEMOPTYSIS 11/19/2008  . WRIST PAIN, RIGHT 12/13/2007  . SLEEP APNEA 12/13/2007  . ORTHOSTATIC DIZZINESS 04/25/2007  . HYPERLIPIDEMIA 03/23/2007  . Depressive disorder, not elsewhere classified 03/23/2007  . HYPERTENSION, BENIGN ESSENTIAL 03/23/2007  . Coronary atherosclerosis 03/23/2007  . Esophagitis 03/23/2007  . KIDNEY STONE 03/23/2007  . DEGENERATIVE JOINT DISEASE, KNEES, BILATERAL 03/23/2007  . C V A/STROKE 04/29/2005  Palliative Care Assessment & Plan    Code Status:  DNR  Goals of Care:  Comfort care  Desire for further Chaplaincy support: personal pastor supporting  3. Symptom Management:  Pain/Dyspnea: Morphine 1-2 mg every 15 min prn. If needing frequent doses - may consider utilizing morphine infusion 2 mg/hr with 1-2 mg bolus every 15 min prn.   Secretions: Robinul 0.4 mg every 4 hours prn.   Anxiety: Lorazepam 1 mg every 2 hours prn.   5. Prognosis: Hours - Days  5. Discharge Planning: Likely hospital death but may consider hospice facility   Thank  you for allowing the Palliative Medicine Team to assist in the care of this patient.   Time In: 1145 Time Out: 1205 Total Time Prolonged Time Billed  no     Greater than 50%  of this time was spent counseling and coordinating care related to the above assessment and plan.   Yong Channel, NP Palliative Medicine Team Pager # [phone] (M-F 8a-5p) Team Phone # [phone] (Nights/Weekends)  08/21/2014, 1:09 PM

## 2014-08-21 NOTE — Telephone Encounter (Signed)
LMOVM reminding pt to send remote transmission.   

## 2014-08-22 ENCOUNTER — Encounter: Payer: Self-pay | Admitting: Cardiology

## 2014-08-22 LAB — CULTURE, BLOOD (ROUTINE X 2)

## 2014-08-22 MED ORDER — SCOPOLAMINE 1 MG/3DAYS TD PT72: 1.0000 | MEDICATED_PATCH | TRANSDERMAL | Status: AC

## 2014-08-22 MED ORDER — MORPHINE SULFATE (CONCENTRATE) 10 MG /0.5 ML PO SOLN: 5.0000 mg | ORAL | Status: DC | PRN

## 2014-08-22 MED ORDER — LORAZEPAM 2 MG/ML PO CONC: 1.0000 mg | ORAL | Status: DC | PRN

## 2014-08-22 MED ORDER — GLYCOPYRROLATE 0.2 MG/ML IJ SOLN: 0.4000 mg | INTRAMUSCULAR | Status: AC | PRN

## 2014-08-22 MED ORDER — CHLORHEXIDINE GLUCONATE 0.12 % MT SOLN: 15.0000 mL | Freq: Two times a day (BID) | OROMUCOSAL | Status: AC

## 2014-08-22 NOTE — Discharge Summary (Signed)
Physician Discharge Summary  Kurt [name] MRN: [id number] DOB/AGE: 79-01-1932 79 y.o.  PCP: Crawford Givens, MD   Admit date: 08/19/2014 Discharge date: 08/22/2014  Discharge Diagnoses:   Principal Problem:   Sepsis Active Problems:   HYPERLIPIDEMIA   HYPERTENSION, BENIGN ESSENTIAL   Coronary atherosclerosis   C V A/STROKE   Vascular dementia   CHB (complete heart block)   Pacemaker - Medtronic dual chamber, 2004, new generator 2012   Palliative care encounter   HCAP (healthcare-associated pneumonia)   Sepsis due to undetermined organism   Pressure ulcer   Aspiration pneumonia    Follow-up recommendations Patient is being discharged to hospice facility      Medication List    STOP taking these medications        aspirin EC 81 MG tablet     benzonatate 100 MG capsule  Commonly known as:  TESSALON     divalproex 125 MG capsule  Commonly known as:  DEPAKOTE SPRINKLE     ENDIT EX     guaiFENesin 600 MG 12 hr tablet  Commonly known as:  MUCINEX     liver oil-zinc oxide 40 % ointment  Commonly known as:  DESITIN     Melatonin 3 MG Caps     nystatin cream  Commonly known as:  MYCOSTATIN     QUEtiapine 25 MG tablet  Commonly known as:  SEROQUEL     senna 8.6 MG Tabs tablet  Commonly known as:  SENOKOT      TAKE these medications        acetaminophen 325 MG tablet  Commonly known as:  TYLENOL  Take 650 mg by mouth 2 (two) times daily as needed for mild pain.     chlorhexidine 0.12 % solution  Commonly known as:  PERIDEX  15 mLs by Mouth Rinse route 2 (two) times daily.     glycopyrrolate 0.2 MG/ML injection  Commonly known as:  ROBINUL  Inject 2 mLs (0.4 mg total) into the vein every 4 (four) hours as needed (secretions).     LORazepam 2 MG/ML concentrated solution  Commonly known as:  ATIVAN  Take 0.5 mLs (1 mg total) by mouth every 4 (four) hours as needed for anxiety.     morphine CONCENTRATE 10 mg / 0.5 ml concentrated solution  Take  0.25 mLs (5 mg total) by mouth every 2 (two) hours as needed for severe pain.     scopolamine 1 MG/3DAYS  Commonly known as:  TRANSDERM-SCOP  Place 1 patch (1.5 mg total) onto the skin every 3 (three) days.         Discharge Condition: Poor prognosis  Disposition: Hospice facility   Consults:  Palliative care   Significant Diagnostic Studies:  Dg Chest Portable 1 View  08/19/2014   CLINICAL DATA:  Altered consciousness. Possible right lower lobe pneumonia.  EXAM: PORTABLE CHEST - 1 VIEW  COMPARISON:  07/03/2013  FINDINGS: Prior median sternotomy. Numerous leads and wires project over the chest. Pacer with leads at right atrium and right ventricle. No lead discontinuity. Midline trachea. Normal heart size. No pleural effusion or pneumothorax. Increased right infrahilar density is favored to be due to volume loss and atelectasis. No lobar consolidation.  IMPRESSION: No definite acute disease. Probable right infrahilar volume loss. Consider PA and lateral radiographs.   Electronically Signed   By: Jeronimo Greaves M.D.   On: 08/19/2014 13:04        There were no vitals filed for this visit.  Microbiology: Recent Results (from the past 240 hour(s))  Culture, blood (routine x 2)     Status: None (Preliminary result)   Collection Time: 08/19/14 12:20 PM  Result Value Ref Range Status   Specimen Description BLOOD LEFT FOREARM  Final   Special Requests BOTTLES DRAWN AEROBIC ONLY 2CC  Final   Culture NO GROWTH 3 DAYS  Final   Report Status PENDING  Incomplete  Culture, blood (routine x 2)     Status: None   Collection Time: 08/19/14 12:26 PM  Result Value Ref Range Status   Specimen Description BLOOD ARM RIGHT  Final   Special Requests BOTTLES DRAWN AEROBIC AND ANAEROBIC 10CC  Final   Culture  Setup Time   Final    GRAM POSITIVE COCCI IN CLUSTERS IN BOTH AEROBIC AND ANAEROBIC BOTTLES CRITICAL RESULT CALLED TO, READ BACK BY AND VERIFIED WITH: K.JOSEPHINE RN 0600 08/20/14 E.GADDY     Culture   Final    STAPHYLOCOCCUS SPECIES (COAGULASE NEGATIVE) THE SIGNIFICANCE OF ISOLATING THIS ORGANISM FROM A SINGLE SET OF BLOOD CULTURES WHEN MULTIPLE SETS ARE DRAWN IS UNCERTAIN. PLEASE NOTIFY THE MICROBIOLOGY DEPARTMENT WITHIN ONE WEEK IF SPECIATION AND SENSITIVITIES ARE REQUIRED.    Report Status 08/22/2014 FINAL  Final       Blood Culture    Component Value Date/Time   SDES BLOOD ARM RIGHT 08/19/2014 1226   SPECREQUEST BOTTLES DRAWN AEROBIC AND ANAEROBIC 10CC 08/19/2014 1226   CULT  08/19/2014 1226    STAPHYLOCOCCUS SPECIES (COAGULASE NEGATIVE) THE SIGNIFICANCE OF ISOLATING THIS ORGANISM FROM A SINGLE SET OF BLOOD CULTURES WHEN MULTIPLE SETS ARE DRAWN IS UNCERTAIN. PLEASE NOTIFY THE MICROBIOLOGY DEPARTMENT WITHIN ONE WEEK IF SPECIATION AND SENSITIVITIES ARE REQUIRED.    REPTSTATUS 08/22/2014 FINAL 08/19/2014 1226      Labs: No results found for this or any previous visit (from the past 48 hour(s)).   Lipid Panel     Component Value Date/Time   CHOL 118 07/10/2013 1334   TRIG 86 07/10/2013 1334   HDL 54 07/10/2013 1334   CHOLHDL 2.2 07/10/2013 1334   VLDL 17 07/10/2013 1334   LDLCALC 47 07/10/2013 1334   LDLDIRECT 74 03/23/2007 1529     No results found for: HGBA1C   Lab Results  Component Value Date   LDLCALC 47 07/10/2013   CREATININE 1.54* 08/19/2014     HPI :*81 year old male with past history of dementia and CVA brought in on 6/19 from skilled nursing facility after he was found to be unresponsive, slowly worsening over the last month. Found tobe in septic shock from a suspected right lower lobe aspiration pneumonia. Given overall decline, poor quality of life even prior to this event, patient's family decided to make him comfort care.  Patient seen in room. Family present. Comfortable on supplemental oxygen mask.  HOSPITAL COURSE:  Sepsis secondary to aspiration pneumonia: Plan for comfort care only. Medication for pain, agitation. Appreciate  palliative care support. Holding home medications. Patient meets criteria for sepsis given lactic acid level, hypotension, tachycardia and pulmonary source,   Vascular dementia  CHB (complete heart block)status post pacemaker.      Pressure ulcer: Present on admission    Discharge Exam:    Blood pressure 104/65, pulse 103, temperature 98 F (36.7 C), temperature source Axillary, resp. rate 15, SpO2 90 %.    General: Nonresponsive, occasionally will spontaneously open eyes and then close them again  Cardiovascular: regular rhythm, borderline tachycardia  Respiratory: decreased breath sounds throughout  Abdomen: soft, nondistended,  hypoactive bowel sounds  Musculoskeletal: no clubbing or cyanosis or edema        Discharge Instructions    Diet - low sodium heart healthy    Complete by:  As directed      Increase activity slowly    Complete by:  As directed              Signed: Cyann Venti 08/22/2014, 2:49 PM        Time spent >45 mins

## 2014-08-22 NOTE — Clinical Social Work Note (Signed)
Clinical Social Work Assessment  Patient Details  Name: Kurt [name] MRN: [id number] Date of Birth: 12-Sep-1931  Date of referral:  08/22/14               Reason for consult:  End of Life/Hospice                Permission sought to share information with:  Family Supports Permission granted to share information::  Yes, Verbal Permission Granted  Name::     Kurt [name] patient's wife  Agency::  Psychologist, sport and exercise Admissions  Relationship::     Contact Information:     Housing/Transportation Living arrangements for the past 2 months:  Single Family Home Source of Information:  Medical Team, Palliative Care Team Patient Interpreter Needed:  None Criminal Activity/Legal Involvement Pertinent to Current Situation/Hospitalization:  No - Comment as needed Significant Relationships:  Adult Children, Spouse, Church Lives with:  Spouse Do you feel safe going back to the place where you live?  No Need for family participation in patient care:  Yes (Comment) (Patient is towards end of life.)  Care giving concerns:  Patient is on comfort care   Social Worker assessment / plan: Patient is near end of life family are very supportive.  Patient's family is at bedside, patient has had several visitors.  Patient's family is in agreement to going to [facility] residential hospice.  Patient's family has been emotional and have been supportive of his wish to be on comfort care.  Patient's family have expressed how he has gone through so much, and he would not want any extensive measures.  CSW to continue to provide support for the family.   Employment status:  Retired Database administrator PT Recommendations:  Not assessed at this time Information / Referral to community resources:     Patient/Family's Response to care: Patient is on comfort care and nearing end of life.  Patient/Family's Understanding of and Emotional Response to Diagnosis, Current Treatment, and Prognosis:   Patient's family is dealing with end of life, patient will be transferring to [facility] residential hospice.  Emotional Assessment Appearance:  Appears stated age Attitude/Demeanor/Rapport:  Unable to Assess Affect (typically observed):  Unable to Assess Orientation:   (Unable to assess) Alcohol / Substance use:  Not Applicable Psych involvement (Current and /or in the community):     Discharge Needs  Concerns to be addressed:    Readmission within the last 30 days:  No Current discharge risk:  Other (Patient is at end of life will be going to residential hospice) Barriers to Discharge:  No Barriers Identified   Darleene Cleaver, LCSWA 08/22/2014, 5:07 PM

## 2014-08-22 NOTE — Clinical Social Work Note (Signed)
CSW spoke to [employer] [facility] who informed CSW that [facility] will have a bed available tomorrow morning.  [employer] admissions social worker met and spoke with family.  CSW to continue to follow patient and facilitate discharge planning to [facility].  Jones Broom. [place], MSW, [place] 08/22/2014 5:01 PM

## 2014-08-22 NOTE — Consult Note (Signed)
[facility] Liaison:  Received request from CSW for family interest in [facility]. Received report from PMT NP also. Chart reviewed and spoke briefly with spouse to acknowledge request and offer support. Will followup with CSW and family in am. Please do not hesitate to contact me with questions. Thank you. Forrestine Him LCSW [phone]

## 2014-08-22 NOTE — Progress Notes (Signed)
Daily Progress Note   Patient Name: Kurt [name]       Date: 08/22/2014 DOB: 1931/11/16  Age: 81 y.o. MRN#: [id number] Attending Physician: Richarda Overlie, MD Primary Care Physician: Crawford Givens, MD Admit Date: 08/19/2014  Subjective:     Mr. Viviani is much unchanged although lungs not as rhonchus/wet there is less air movement. Respirations continue to appear regular with no apnea noted. Offered support to patient and family at bedside. His wife says that her birthday is tomorrow and she feels certain he will pass then. We did discuss hospice - specifically [facility] and they were open to this option. Wife says "no where but [facility]."    Length of Stay: 3 days  Current Medications: Scheduled Meds:  . antiseptic oral rinse  7 mL Mouth Rinse q12n4p  . chlorhexidine  15 mL Mouth Rinse BID  . scopolamine  1 patch Transdermal Q72H    Continuous Infusions:    PRN Meds: acetaminophen, glycopyrrolate, LORazepam, morphine injection, ondansetron **OR** ondansetron (ZOFRAN) IV  Palliative Performance Scale: 10%     Vital Signs: BP 104/65 mmHg  Pulse 103  Temp(Src) 98 F (36.7 C) (Axillary)  Resp 15  SpO2 90% SpO2: SpO2: 90 % O2 Device: O2 Device: Not Delivered O2 Flow Rate: O2 Flow Rate (L/min): 15 L/min  Intake/output summary:  Intake/Output Summary (Last 24 hours) at 08/22/14 1328 Last data filed at 08/22/14 1028  Gross per 24 hour  Intake      0 ml  Output   1050 ml  Net  -1050 ml   LBM: Last BM Date: 08/19/14 Baseline Weight:   Most recent weight:    Physical Exam: General: NAD, lying in bed HEENT: [place]/AT CVS: Paced, RRR Resp: Breathing unlabored, regular, no apnea noted on exam Abd: Soft, NT, ND Extrem: Bilat feet wrapped, toes cool to touch, withdraws to pain when lightly touch feet Neuro: Minimally responsive, eyes open at times   Additional Data Reviewed: No results for input(s): WBC, HGB, PLT, NA, BUN, CREATININE, ALB in the last 72  hours.   Problem List:  Patient Active Problem List   Diagnosis Date Noted  . Pressure ulcer 08/20/2014  . Aspiration pneumonia   . Sepsis 08/19/2014  . Palliative care encounter 08/19/2014  . HCAP (healthcare-associated pneumonia)   . Sepsis due to undetermined organism   . Edema 10/07/2013  . Insomnia 10/07/2013  . CHB (complete heart block) 05/30/2013  . Pacemaker - Medtronic dual chamber, 2004, new generator 2012 05/30/2013  . Falls frequently 05/30/2013  . Vascular dementia 11/14/2012  . GERD (gastroesophageal reflux disease) 08/27/2012  . Vomiting 07/26/2012  . Inguinal hernia 05/11/2012  . Phimosis 01/26/2012  . Cough 10/12/2011  . Dementia 07/01/2011  . CHRONIC RHINITIS 11/19/2008  . HEMOPTYSIS 11/19/2008  . WRIST PAIN, RIGHT 12/13/2007  . SLEEP APNEA 12/13/2007  . ORTHOSTATIC DIZZINESS 04/25/2007  . HYPERLIPIDEMIA 03/23/2007  . Depressive disorder, not elsewhere classified 03/23/2007  . HYPERTENSION, BENIGN ESSENTIAL 03/23/2007  . Coronary atherosclerosis 03/23/2007  . Esophagitis 03/23/2007  . KIDNEY STONE 03/23/2007  . DEGENERATIVE JOINT DISEASE, KNEES, BILATERAL 03/23/2007  . C V A/STROKE 04/29/2005     Palliative Care Assessment & Plan    Code Status:  DNR  Goals of Care:  Comfort care  Desire for further Chaplaincy support: personal pastor supporting  3. Symptom Management:  Pain/Dyspnea: Morphine 1-2 mg every 15 min prn. If needing frequent doses - may consider utilizing morphine infusion 2 mg/hr with 1-2 mg  bolus every 15 min prn.   Secretions: Robinul 0.4 mg every 4 hours prn.   Anxiety: Lorazepam 1 mg every 2 hours prn.   5. Prognosis: Hours - Days  5. Discharge Planning: Likely hospital death but may consider hospice facility  Thank you for allowing the Palliative Medicine Team to assist in the care of this patient.   Time In: 0945 Time Out: 1005 Total Time Prolonged Time Billed  no     Greater than 50%  of this time  was spent counseling and coordinating care related to the above assessment and plan.     Yong Channel, NP Palliative Medicine Team Pager # [phone] (M-F 8a-5p) Team Phone # [phone] (Nights/Weekends)  08/22/2014, 1:28 PM

## 2014-08-22 NOTE — Progress Notes (Signed)
Chaplain visited with family per family request. Chaplain spoke with family about Pt's life. Chaplain let family know they are here if they need Chaplain.    08/22/14 1100  Clinical Encounter Type  Visited With Family  Visit Type Spiritual support  Referral From Family  Spiritual Encounters  Spiritual Needs Emotional

## 2014-08-23 MED ORDER — LORAZEPAM 2 MG/ML PO CONC: 1.0000 mg | ORAL | Status: AC | PRN

## 2014-08-23 MED ORDER — MORPHINE SULFATE (CONCENTRATE) 10 MG /0.5 ML PO SOLN: 5.0000 mg | ORAL | Status: AC | PRN

## 2014-08-23 NOTE — Progress Notes (Signed)
Daily Progress Note   Patient Name: Kurt [name]       Date: 08/23/2014 DOB: 02-Aug-1931  Age: 81 y.o. MRN#: [id number] Attending Physician: Richarda Overlie, MD Primary Care Physician: Crawford Givens, MD Admit Date: 08/19/2014  Subjective:     Kurt [name] does appear to have progressed: lips appear cyanotic, toes cold, jaw more relaxed. However, family at bedside say he has had less apneic episodes since yesterday afternoon. I have not witnessed any apnea. Support provided to family at bedside. Wife says she may be open to [facility] today. He is at high risk for acute decline and death at this point - monitor closely for stability to transfer.    Length of Stay: 4 days  Current Medications: Scheduled Meds:  . antiseptic oral rinse  7 mL Mouth Rinse q12n4p  . chlorhexidine  15 mL Mouth Rinse BID  . scopolamine  1 patch Transdermal Q72H    Continuous Infusions:    PRN Meds: acetaminophen, glycopyrrolate, LORazepam, morphine injection, ondansetron **OR** ondansetron (ZOFRAN) IV  Palliative Performance Scale: 10%     Vital Signs: BP 106/62 mmHg  Pulse 103  Temp(Src) 100.9 F (38.3 C) (Axillary)  Resp 24  SpO2 87% SpO2: SpO2: (!) 87 % O2 Device: O2 Device: Not Delivered O2 Flow Rate: O2 Flow Rate (L/min): 15 L/min  Intake/output summary:   Intake/Output Summary (Last 24 hours) at 08/23/14 1206 Last data filed at 08/23/14 0558  Gross per 24 hour  Intake      0 ml  Output    875 ml  Net   -875 ml   LBM: Last BM Date: 08/19/14 Baseline Weight:   Most recent weight:    Physical Exam: General: NAD, lying in bed HEENT: [place]/AT, jaw more relaxed, lips cyanotic CVS: Paced, RRR Resp: Breathing unlabored, regular, no apnea noted on exam Abd: Soft, NT, ND Extrem: Bilat feet wrapped, toes cold to touch, withdraws to pain when lightly touch feet Neuro: Less responsive today - no eye opening, did withdrawal when I touched his toes   Additional Data Reviewed: No  results for input(s): WBC, HGB, PLT, NA, BUN, CREATININE, ALB in the last 72 hours.   Problem List:  Patient Active Problem List   Diagnosis Date Noted  . Pressure ulcer 08/20/2014  . Aspiration pneumonia   . Sepsis 08/19/2014  . Palliative care encounter 08/19/2014  . HCAP (healthcare-associated pneumonia)   . Sepsis due to undetermined organism   . Edema 10/07/2013  . Insomnia 10/07/2013  . CHB (complete heart block) 05/30/2013  . Pacemaker - Medtronic dual chamber, 2004, new generator 2012 05/30/2013  . Falls frequently 05/30/2013  . Vascular dementia 11/14/2012  . GERD (gastroesophageal reflux disease) 08/27/2012  . Vomiting 07/26/2012  . Inguinal hernia 05/11/2012  . Phimosis 01/26/2012  . Cough 10/12/2011  . Dementia 07/01/2011  . CHRONIC RHINITIS 11/19/2008  . HEMOPTYSIS 11/19/2008  . WRIST PAIN, RIGHT 12/13/2007  . SLEEP APNEA 12/13/2007  . ORTHOSTATIC DIZZINESS 04/25/2007  . HYPERLIPIDEMIA 03/23/2007  . Depressive disorder, not elsewhere classified 03/23/2007  . HYPERTENSION, BENIGN ESSENTIAL 03/23/2007  . Coronary atherosclerosis 03/23/2007  . Esophagitis 03/23/2007  . KIDNEY STONE 03/23/2007  . DEGENERATIVE JOINT DISEASE, KNEES, BILATERAL 03/23/2007  . C V A/STROKE 04/29/2005     Palliative Care Assessment & Plan    Code Status:  DNR  Goals of Care:  Comfort care  Desire for further Chaplaincy support: personal pastor supporting  3. Symptom Management:  Pain/Dyspnea: Morphine 1-2 mg every  15 min prn. If needing frequent doses - may consider utilizing morphine infusion 2 mg/hr with 1-2 mg bolus every 15 min prn.   Secretions: Robinul 0.4 mg every 4 hours prn.   Anxiety: Lorazepam 1 mg every 2 hours prn.   5. Prognosis: Hours - Days  5. Discharge Planning: hospital death vs hospice facility  Thank you for allowing the Palliative Medicine Team to assist in the care of this patient.   Time In: 1055 Time Out: 1115 Total Time  Prolonged Time Billed  no     Greater than 50%  of this time was spent counseling and coordinating care related to the above assessment and plan.     Yong Channel, NP Palliative Medicine Team Pager # [phone] (M-F 8a-5p) Team Phone # [phone] (Nights/Weekends)  08/23/2014, 12:06 PM

## 2014-08-23 NOTE — Discharge Summary (Signed)
Physician Discharge Summary  Kurt [name] MRN: [id number] DOB/AGE: Mar 24, 1931 82 y.o.  PCP: Crawford Givens, MD   Admit date: 08/19/2014 Discharge date: 08/23/2014  Discharge Diagnoses:   Principal Problem:   Sepsis Active Problems:   HYPERLIPIDEMIA   HYPERTENSION, BENIGN ESSENTIAL   Coronary atherosclerosis   C V A/STROKE   Vascular dementia   CHB (complete heart block)   Pacemaker - Medtronic dual chamber, 2004, new generator 2012   Palliative care encounter   HCAP (healthcare-associated pneumonia)   Sepsis due to undetermined organism   Pressure ulcer   Aspiration pneumonia    Follow-up recommendations Patient is being discharged to hospice facility      Medication List    STOP taking these medications        aspirin EC 81 MG tablet     benzonatate 100 MG capsule  Commonly known as:  TESSALON     divalproex 125 MG capsule  Commonly known as:  DEPAKOTE SPRINKLE     ENDIT EX     guaiFENesin 600 MG 12 hr tablet  Commonly known as:  MUCINEX     liver oil-zinc oxide 40 % ointment  Commonly known as:  DESITIN     Melatonin 3 MG Caps     nystatin cream  Commonly known as:  MYCOSTATIN     QUEtiapine 25 MG tablet  Commonly known as:  SEROQUEL     senna 8.6 MG Tabs tablet  Commonly known as:  SENOKOT      TAKE these medications        acetaminophen 325 MG tablet  Commonly known as:  TYLENOL  Take 650 mg by mouth 2 (two) times daily as needed for mild pain.     chlorhexidine 0.12 % solution  Commonly known as:  PERIDEX  15 mLs by Mouth Rinse route 2 (two) times daily.     glycopyrrolate 0.2 MG/ML injection  Commonly known as:  ROBINUL  Inject 2 mLs (0.4 mg total) into the vein every 4 (four) hours as needed (secretions).     LORazepam 2 MG/ML concentrated solution  Commonly known as:  ATIVAN  Take 0.5 mLs (1 mg total) by mouth every 4 (four) hours as needed for anxiety.     morphine CONCENTRATE 10 mg / 0.5 ml concentrated solution  Take  0.25 mLs (5 mg total) by mouth every 2 (two) hours as needed for severe pain.     scopolamine 1 MG/3DAYS  Commonly known as:  TRANSDERM-SCOP  Place 1 patch (1.5 mg total) onto the skin every 3 (three) days.         Discharge Condition: Poor prognosis  Disposition: Hospice facility   Consults:  Palliative care   Significant Diagnostic Studies:  Dg Chest Portable 1 View  08/19/2014   CLINICAL DATA:  Altered consciousness. Possible right lower lobe pneumonia.  EXAM: PORTABLE CHEST - 1 VIEW  COMPARISON:  07/03/2013  FINDINGS: Prior median sternotomy. Numerous leads and wires project over the chest. Pacer with leads at right atrium and right ventricle. No lead discontinuity. Midline trachea. Normal heart size. No pleural effusion or pneumothorax. Increased right infrahilar density is favored to be due to volume loss and atelectasis. No lobar consolidation.  IMPRESSION: No definite acute disease. Probable right infrahilar volume loss. Consider PA and lateral radiographs.   Electronically Signed   By: Jeronimo Greaves M.D.   On: 08/19/2014 13:04        There were no vitals filed for this visit.  Microbiology: Recent Results (from the past 240 hour(s))  Culture, blood (routine x 2)     Status: None (Preliminary result)   Collection Time: 08/19/14 12:20 PM  Result Value Ref Range Status   Specimen Description BLOOD LEFT FOREARM  Final   Special Requests BOTTLES DRAWN AEROBIC ONLY 2CC  Final   Culture NO GROWTH 3 DAYS  Final   Report Status PENDING  Incomplete  Culture, blood (routine x 2)     Status: None   Collection Time: 08/19/14 12:26 PM  Result Value Ref Range Status   Specimen Description BLOOD ARM RIGHT  Final   Special Requests BOTTLES DRAWN AEROBIC AND ANAEROBIC 10CC  Final   Culture  Setup Time   Final    GRAM POSITIVE COCCI IN CLUSTERS IN BOTH AEROBIC AND ANAEROBIC BOTTLES CRITICAL RESULT CALLED TO, READ BACK BY AND VERIFIED WITH: K.JOSEPHINE RN 0600 08/20/14 E.GADDY     Culture   Final    STAPHYLOCOCCUS SPECIES (COAGULASE NEGATIVE) THE SIGNIFICANCE OF ISOLATING THIS ORGANISM FROM A SINGLE SET OF BLOOD CULTURES WHEN MULTIPLE SETS ARE DRAWN IS UNCERTAIN. PLEASE NOTIFY THE MICROBIOLOGY DEPARTMENT WITHIN ONE WEEK IF SPECIATION AND SENSITIVITIES ARE REQUIRED.    Report Status 08/22/2014 FINAL  Final       Blood Culture    Component Value Date/Time   SDES BLOOD ARM RIGHT 08/19/2014 1226   SPECREQUEST BOTTLES DRAWN AEROBIC AND ANAEROBIC 10CC 08/19/2014 1226   CULT  08/19/2014 1226    STAPHYLOCOCCUS SPECIES (COAGULASE NEGATIVE) THE SIGNIFICANCE OF ISOLATING THIS ORGANISM FROM A SINGLE SET OF BLOOD CULTURES WHEN MULTIPLE SETS ARE DRAWN IS UNCERTAIN. PLEASE NOTIFY THE MICROBIOLOGY DEPARTMENT WITHIN ONE WEEK IF SPECIATION AND SENSITIVITIES ARE REQUIRED.    REPTSTATUS 08/22/2014 FINAL 08/19/2014 1226      Labs: No results found for this or any previous visit (from the past 48 hour(s)).   Lipid Panel     Component Value Date/Time   CHOL 118 07/10/2013 1334   TRIG 86 07/10/2013 1334   HDL 54 07/10/2013 1334   CHOLHDL 2.2 07/10/2013 1334   VLDL 17 07/10/2013 1334   LDLCALC 47 07/10/2013 1334   LDLDIRECT 74 03/23/2007 1529     No results found for: HGBA1C   Lab Results  Component Value Date   LDLCALC 47 07/10/2013   CREATININE 1.54* 08/19/2014     HPI :*81 year old male with past history of dementia and CVA brought in on 6/19 from skilled nursing facility after he was found to be unresponsive, slowly worsening over the last month. Found tobe in septic shock from a suspected right lower lobe aspiration pneumonia. Given overall decline, poor quality of life even prior to this event, patient's family decided to make him comfort care.  Patient seen in room. Family present. Comfortable on supplemental oxygen mask.  HOSPITAL COURSE:  Sepsis secondary to aspiration pneumonia: Plan for comfort care only. Medication for pain, agitation. Appreciate  palliative care support. Holding home medications. Patient meets criteria for sepsis given lactic acid level, hypotension, tachycardia and pulmonary source,   Vascular dementia  CHB (complete heart block)status post pacemaker.      Pressure ulcer: Present on admission    Discharge Exam:    Blood pressure 106/62, pulse 103, temperature 100.9 F (38.3 C), temperature source Axillary, resp. rate 24, SpO2 87 %.    General: Nonresponsive, occasionally will spontaneously open eyes and then close them again  Cardiovascular: regular rhythm, borderline tachycardia  Respiratory: decreased breath sounds throughout  Abdomen: soft, nondistended,  hypoactive bowel sounds  Musculoskeletal: no clubbing or cyanosis or edema    Discharge Instructions    Diet - low sodium heart healthy    Complete by:  As directed      Increase activity slowly    Complete by:  As directed              Signed: Briscoe Daniello 08/23/2014, 12:53 PM        Time spent >45 mins

## 2014-08-23 NOTE — Progress Notes (Signed)
Patient ready for discharged. Transferred to facility by [facility]. All documents sent with patient. Report called and given to Herbert Seta, Charity fundraiser at [facility].

## 2014-08-23 NOTE — Clinical Social Work Note (Signed)
Patient to be d/c'ed today to [facility].  Patient and family agreeable to plans will transport via ems RN to call report.  Patient's wife was informed that patient will be transported today.   Windell Moulding, MSW, [facility] [phone]

## 2014-08-24 LAB — CULTURE, BLOOD (ROUTINE X 2): Culture: NO GROWTH

## 2014-08-28 ENCOUNTER — Telehealth: Payer: Self-pay | Admitting: Family Medicine

## 2014-08-28 NOTE — Telephone Encounter (Signed)
Called and LMOVM x2 for patient's wife.  Called to offer my support given her husband's illnesses.

## 2014-08-30 DEATH — deceased

## 2014-10-03 ENCOUNTER — Telehealth: Payer: Self-pay

## 2014-10-03 NOTE — Telephone Encounter (Signed)
Kurt [name] with [facility] Care request face to face office visit notes 60 days prior or 30 days after 08/16/14. Kurt [name] had given pt wound care but ins is requiring a face to face visit. Please advise.

## 2014-10-04 NOTE — Telephone Encounter (Signed)
He was unable to have f/u here in that timeline since he died shortly thereafter.  He did have face to face treatment at the hospital (with hospital staff) while he was admitted during that time.   He did have the pressure ulcer requiring wound care at that point.   If they aren't going to cover this at all, then let me know so I can contact the hospital MDs about signing off on his papers (should that be needed). Thanks.

## 2014-10-04 NOTE — Telephone Encounter (Signed)
Carolyn advised.
